# Patient Record
Sex: Female | Born: 1965 | Race: White | Hispanic: No | Smoking: Never smoker
Health system: Southern US, Community
[De-identification: ages and names within clinical notes are randomized; demographics above are authoritative.]

## PROBLEM LIST (undated history)

## (undated) DIAGNOSIS — Z973 Presence of spectacles and contact lenses: Secondary | ICD-10-CM

## (undated) DIAGNOSIS — H8110 Benign paroxysmal vertigo, unspecified ear: Secondary | ICD-10-CM

## (undated) DIAGNOSIS — J45909 Unspecified asthma, uncomplicated: Secondary | ICD-10-CM

## (undated) DIAGNOSIS — K219 Gastro-esophageal reflux disease without esophagitis: Secondary | ICD-10-CM

## (undated) DIAGNOSIS — M5020 Other cervical disc displacement, unspecified cervical region: Secondary | ICD-10-CM

## (undated) HISTORY — PX: UTERINE FIBROID SURGERY: SHX826

## (undated) HISTORY — PX: TONSILLECTOMY: SUR1361

## (undated) HISTORY — PX: COLONOSCOPY: SHX174

---

## 2006-10-01 ENCOUNTER — Ambulatory Visit: Payer: Self-pay | Admitting: Unknown Physician Specialty

## 2008-10-30 DEATH — deceased

## 2009-11-07 DIAGNOSIS — Z719 Counseling, unspecified: Secondary | ICD-10-CM | POA: Insufficient documentation

## 2012-04-30 ENCOUNTER — Inpatient Hospital Stay: Payer: Self-pay | Admitting: Psychiatry

## 2012-04-30 LAB — COMPREHENSIVE METABOLIC PANEL
Alkaline Phosphatase: 65 U/L (ref 50–136)
BUN: 14 mg/dL (ref 7–18)
Bilirubin,Total: 0.6 mg/dL (ref 0.2–1.0)
Calcium, Total: 9.1 mg/dL (ref 8.5–10.1)
Chloride: 107 mmol/L (ref 98–107)
Co2: 23 mmol/L (ref 21–32)
EGFR (Non-African Amer.): >60
Glucose: 133 mg/dL — ABNORMAL HIGH (ref 65–99)
SGOT(AST): 11 U/L — ABNORMAL LOW (ref 15–37)
SGPT (ALT): 15 U/L (ref 12–78)

## 2012-04-30 LAB — CBC
MCH: 33.4 pg (ref 26.0–34.0)
MCHC: 35.1 g/dL (ref 32.0–36.0)
MCV: 95 fL (ref 80–100)
Platelet: 300 10*3/uL (ref 150–440)
RBC: 4.46 10*6/uL (ref 3.80–5.20)
RDW: 12.9 % (ref 11.5–14.5)

## 2012-04-30 LAB — URINALYSIS, COMPLETE
Bacteria: NONE SEEN
Bilirubin,UR: NEGATIVE
Leukocyte Esterase: NEGATIVE
Nitrite: NEGATIVE
Protein: NEGATIVE

## 2012-04-30 LAB — DRUG SCREEN, URINE
Barbiturates, Ur Screen: NEGATIVE (ref ?–200)
Benzodiazepine, Ur Scrn: POSITIVE (ref ?–200)
Cannabinoid 50 Ng, Ur ~~LOC~~: NEGATIVE (ref ?–50)
Cocaine Metabolite,Ur ~~LOC~~: NEGATIVE (ref ?–300)
MDMA (Ecstasy)Ur Screen: NEGATIVE (ref ?–500)
Tricyclic, Ur Screen: NEGATIVE (ref ?–1000)

## 2012-04-30 LAB — ACETAMINOPHEN LEVEL: Acetaminophen: <2 ug/mL

## 2012-04-30 LAB — PREGNANCY, URINE: Pregnancy Test, Urine: NEGATIVE m[IU]/mL

## 2012-04-30 LAB — ETHANOL
Ethanol %: <0.003 % (ref 0.000–0.080)
Ethanol: <3 mg/dL

## 2012-05-31 DIAGNOSIS — R202 Paresthesia of skin: Secondary | ICD-10-CM | POA: Insufficient documentation

## 2014-05-03 ENCOUNTER — Ambulatory Visit (INDEPENDENT_AMBULATORY_CARE_PROVIDER_SITE_OTHER): Payer: BC Managed Care – PPO

## 2014-05-03 ENCOUNTER — Ambulatory Visit (INDEPENDENT_AMBULATORY_CARE_PROVIDER_SITE_OTHER): Payer: BC Managed Care – PPO | Admitting: Podiatry

## 2014-05-03 ENCOUNTER — Encounter: Payer: Self-pay | Admitting: Podiatry

## 2014-05-03 VITALS — BP 110/84 | HR 95 | Resp 16 | Ht 61.0 in | Wt 120.0 lb

## 2014-05-03 DIAGNOSIS — D3610 Benign neoplasm of peripheral nerves and autonomic nervous system, unspecified: Secondary | ICD-10-CM

## 2014-05-03 DIAGNOSIS — M778 Other enthesopathies, not elsewhere classified: Secondary | ICD-10-CM

## 2014-05-03 DIAGNOSIS — M779 Enthesopathy, unspecified: Principal | ICD-10-CM

## 2014-05-03 DIAGNOSIS — D219 Benign neoplasm of connective and other soft tissue, unspecified: Secondary | ICD-10-CM

## 2014-05-03 DIAGNOSIS — M7750 Other enthesopathy of unspecified foot: Secondary | ICD-10-CM

## 2014-05-03 DIAGNOSIS — M79609 Pain in unspecified limb: Secondary | ICD-10-CM

## 2014-05-03 MED ORDER — MELOXICAM 15 MG PO TABS: 15.0000 mg | ORAL_TABLET | Freq: Every day | ORAL | Status: DC

## 2014-05-03 MED ORDER — METHYLPREDNISOLONE (PAK) 4 MG PO TABS: ORAL_TABLET | ORAL | Status: DC

## 2014-05-03 NOTE — Progress Notes (Signed)
   Subjective:    Patient ID: Evelyn Choi, female    DOB: July 06, 1966, 48 y.o.   MRN: [id number]  HPI Comments: Its my left foot under my 2nd and 3rd toes. My right foot hurts under the toes too. Its been going on since April. The pain goes from worse to better. It hurts to walk and it hurts to wear shoes. When i walk fast the pain is worse. i have tried to stay off my foot, elevation, rub peppermint on it.  Foot Pain      Review of Systems  HENT: Positive for sinus pressure.   Allergic/Immunologic: Positive for environmental allergies.  Neurological: Positive for tremors and light-headedness.  Psychiatric/Behavioral: The patient is nervous/anxious.   All other systems reviewed and are negative.      Objective:   Physical Exam: I have reviewed her past mental history medications allergies surgeries social history and review of systems. Pulses are strongly palpable bilateral. Neurologic sensorium is intact per Semmes-Weinstein monofilament. Degenerative flexor intact bilateral muscle strength is 5 over 5 dorsiflexors plantar flexors inverters everters all intrinsic musculature is intact. She has pain on palpation and on end range of motion of the second metatarsophalangeal joint of the left foot. Radiographic evaluation does not demonstrate any type of osseous abnormalities the bilateral foot.        Assessment & Plan:  Assessment: Capsulitis second metatarsophalangeal joint left foot.  Plan: Injected around the joint of the second metatarsophalangeal joint today placed her in a carbon graphite insole left foot and wrote a prescription for Medrol Dosepak to be followed by meloxicam appear

## 2014-06-07 ENCOUNTER — Ambulatory Visit: Payer: BC Managed Care – PPO | Admitting: Podiatry

## 2014-12-06 DIAGNOSIS — N3010 Interstitial cystitis (chronic) without hematuria: Secondary | ICD-10-CM | POA: Insufficient documentation

## 2014-12-06 DIAGNOSIS — N3020 Other chronic cystitis without hematuria: Secondary | ICD-10-CM | POA: Insufficient documentation

## 2014-12-06 DIAGNOSIS — Q600 Renal agenesis, unilateral: Secondary | ICD-10-CM | POA: Insufficient documentation

## 2014-12-06 DIAGNOSIS — R3129 Other microscopic hematuria: Secondary | ICD-10-CM | POA: Insufficient documentation

## 2014-12-06 DIAGNOSIS — R339 Retention of urine, unspecified: Secondary | ICD-10-CM | POA: Insufficient documentation

## 2014-12-19 NOTE — Consult Note (Signed)
Brief Consult Note: Diagnosis: Major depressive disorder, panic disorder with agoraphobia..   Patient was seen by consultant.   Recommend further assessment or treatment.   Orders entered.   Discussed with Attending MD.   Comments: a lifelong h/o depression and anxiety. She did well on Celexa in the past but d/ced medication several months ago. For the past two months she has been increasingly depresed and suicidal. her father committed a suicida at the age of 48 and she is "just like my father". She has intrusive thoughts of suicide. She removed guns from the house afraid thet she would "snap just like my father". She has panic attacks "all the time".  PLAN: will admitt to [facility]..  Electronic Signatures: Orson Slick (MD)  (Signed [phone] 16:21)  Authored: Brief Consult Note   Last Updated: 30-Aug-13 16:21 by Orson Slick (MD)
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
## 2015-04-03 DIAGNOSIS — J45909 Unspecified asthma, uncomplicated: Secondary | ICD-10-CM | POA: Insufficient documentation

## 2016-02-21 DIAGNOSIS — G250 Essential tremor: Secondary | ICD-10-CM | POA: Insufficient documentation

## 2016-06-10 ENCOUNTER — Telehealth: Payer: Self-pay | Admitting: Gastroenterology

## 2016-06-10 NOTE — Telephone Encounter (Signed)
colonoscopy

## 2016-06-25 ENCOUNTER — Other Ambulatory Visit: Payer: Self-pay

## 2016-06-25 NOTE — Telephone Encounter (Signed)
Gastroenterology Pre-Procedure Review  Request Date: 07/09/2016 Requesting Physician: Dr. Vicente Males  PATIENT REVIEW QUESTIONS: The patient responded to the following health history questions as indicated:    1. Are you having any GI issues? no 2. Do you have a personal history of Polyps? no 3. Do you have a family history of Colon Cancer or Polyps? yes (sister, polyps) 4. Diabetes Mellitus? no 5. Joint replacements in the past 12 months?no 6. Major health problems in the past 3 months?no 7. Any artificial heart valves, MVP, or defibrillator?no    MEDICATIONS & ALLERGIES:    Patient reports the following regarding taking any anticoagulation/antiplatelet therapy:   Plavix, Coumadin, Eliquis, Xarelto, Lovenox, Pradaxa, Brilinta, or Effient? no Aspirin? no  Patient confirms/reports the following medications:  Current Outpatient Prescriptions  Medication Sig Dispense Refill  . citalopram (CELEXA) 10 MG tablet Take by mouth.    . meloxicam (MOBIC) 15 MG tablet Take 1 tablet (15 mg total) by mouth daily. 30 tablet 3  . methylPREDNIsolone (MEDROL DOSPACK) 4 MG tablet follow package directions 21 tablet 0  . propranolol (INDERAL) 20 MG tablet Take 20 mg by mouth 3 (three) times daily.     No current facility-administered medications for this visit.     Patient confirms/reports the following allergies:  Allergies  Allergen Reactions  . Macrobid [Nitrofurantoin] Shortness Of Breath and Rash    Chest Pains  . Lexapro [Escitalopram Oxalate] Anxiety    No orders of the defined types were placed in this encounter.   AUTHORIZATION INFORMATION Primary Insurance: 1D#: Group #:  Secondary Insurance: 1D#: Group #:  SCHEDULE INFORMATION: Date:  Time: Location:

## 2016-06-25 NOTE — Telephone Encounter (Signed)
Screening Colonoscopy Z12.11 [facility] [id number] BCBS Pre cert is not required

## 2016-07-02 ENCOUNTER — Encounter: Payer: Self-pay | Admitting: *Deleted

## 2016-07-09 ENCOUNTER — Ambulatory Visit
Admission: RE | Admit: 2016-07-09 | Discharge: 2016-07-09 | Disposition: A | Discharge status: Home / Self Care | Payer: BLUE CROSS/BLUE SHIELD | Source: Ambulatory Visit | Attending: Gastroenterology | Admitting: Gastroenterology

## 2016-07-09 ENCOUNTER — Ambulatory Visit: Payer: BLUE CROSS/BLUE SHIELD | Admitting: Anesthesiology

## 2016-07-09 ENCOUNTER — Encounter
Admission: RE | Disposition: A | Discharge status: Home / Self Care | Payer: Self-pay | Source: Ambulatory Visit | Attending: Gastroenterology

## 2016-07-09 DIAGNOSIS — Z79899 Other long term (current) drug therapy: Secondary | ICD-10-CM | POA: Diagnosis not present

## 2016-07-09 DIAGNOSIS — J45909 Unspecified asthma, uncomplicated: Secondary | ICD-10-CM | POA: Diagnosis not present

## 2016-07-09 DIAGNOSIS — H8110 Benign paroxysmal vertigo, unspecified ear: Secondary | ICD-10-CM | POA: Diagnosis not present

## 2016-07-09 DIAGNOSIS — K219 Gastro-esophageal reflux disease without esophagitis: Secondary | ICD-10-CM | POA: Diagnosis not present

## 2016-07-09 DIAGNOSIS — Z8371 Family history of colonic polyps: Secondary | ICD-10-CM | POA: Diagnosis not present

## 2016-07-09 DIAGNOSIS — Q438 Other specified congenital malformations of intestine: Secondary | ICD-10-CM | POA: Diagnosis not present

## 2016-07-09 DIAGNOSIS — Z1211 Encounter for screening for malignant neoplasm of colon: Secondary | ICD-10-CM | POA: Insufficient documentation

## 2016-07-09 HISTORY — DX: Benign paroxysmal vertigo, unspecified ear: H81.10

## 2016-07-09 HISTORY — PX: COLONOSCOPY WITH PROPOFOL: SHX5780

## 2016-07-09 HISTORY — DX: Presence of spectacles and contact lenses: Z97.3

## 2016-07-09 HISTORY — DX: Other cervical disc displacement, unspecified cervical region: M50.20

## 2016-07-09 HISTORY — DX: Gastro-esophageal reflux disease without esophagitis: K21.9

## 2016-07-09 HISTORY — DX: Unspecified asthma, uncomplicated: J45.909

## 2016-07-09 SURGERY — COLONOSCOPY WITH PROPOFOL
Anesthesia: Monitor Anesthesia Care | Site: Rectum | Wound class: Contaminated

## 2016-07-09 MED ORDER — PROPOFOL 10 MG/ML IV BOLUS
INTRAVENOUS | Status: DC | PRN
  Administered 2016-07-09 (×4): 20 mg via INTRAVENOUS
  Administered 2016-07-09: 70 mg via INTRAVENOUS
  Administered 2016-07-09 (×2): 20 mg via INTRAVENOUS
  Administered 2016-07-09: 30 mg via INTRAVENOUS
  Administered 2016-07-09 (×7): 20 mg via INTRAVENOUS

## 2016-07-09 MED ORDER — OXYCODONE HCL 5 MG PO TABS: 5.0000 mg | ORAL_TABLET | Freq: Once | ORAL | Status: DC | PRN

## 2016-07-09 MED ORDER — LACTATED RINGERS IV SOLN
INTRAVENOUS | Status: DC
  Administered 2016-07-09: 08:00:00 via INTRAVENOUS

## 2016-07-09 MED ORDER — STERILE WATER FOR IRRIGATION IR SOLN
Status: DC | PRN
  Administered 2016-07-09: 08:00:00

## 2016-07-09 MED ORDER — LIDOCAINE HCL (CARDIAC) 20 MG/ML IV SOLN
INTRAVENOUS | Status: DC | PRN
  Administered 2016-07-09: 40 mg via INTRAVENOUS

## 2016-07-09 MED ORDER — OXYCODONE HCL 5 MG/5ML PO SOLN: 5.0000 mg | Freq: Once | ORAL | Status: DC | PRN

## 2016-07-09 SURGICAL SUPPLY — 23 items

## 2016-07-09 NOTE — Anesthesia Preprocedure Evaluation (Signed)
Anesthesia Evaluation   Patient awake    Reviewed: Allergy & Precautions, NPO status   History of Anesthesia Complications Negative for: history of anesthetic complications  Airway Mallampati: I  TM Distance: >3 FB Neck ROM: full    Dental no notable dental hx.    Pulmonary asthma ,    Pulmonary exam normal        Cardiovascular negative cardio ROS Normal cardiovascular exam     Neuro/Psych negative neurological ROS     GI/Hepatic Neg liver ROS, GERD  ,  Endo/Other  negative endocrine ROS  Renal/GU negative Renal ROS  negative genitourinary   Musculoskeletal   Abdominal   Peds negative pediatric ROS (+)  Hematology negative hematology ROS (+)   Anesthesia Other Findings   Reproductive/Obstetrics                             Anesthesia Physical Anesthesia Plan  ASA: II  Anesthesia Plan: MAC   Post-op Pain Management:    Induction:   Airway Management Planned:   Additional Equipment:   Intra-op Plan:   Post-operative Plan:   Informed Consent:   Plan Discussed with:   Anesthesia Plan Comments:         Anesthesia Quick Evaluation

## 2016-07-09 NOTE — Anesthesia Postprocedure Evaluation (Addendum)
Anesthesia Post Note  Patient: Evelyn Choi  Procedure(s) Performed: Procedure(s) (LRB): COLONOSCOPY WITH PROPOFOL (N/A)  Patient location during evaluation: PACU Anesthesia Type: MAC Level of consciousness: awake and alert Pain management: pain level controlled Vital Signs Assessment: post-procedure vital signs reviewed and stable Respiratory status: spontaneous breathing Cardiovascular status: blood pressure returned to baseline Postop Assessment: no headache Anesthetic complications: no    Jaci Standard, III,  Jeriah Corkum D

## 2016-07-09 NOTE — Anesthesia Procedure Notes (Signed)
Procedure Name: MAC Performed by: Matti Killingsworth Pre-anesthesia Checklist: Patient identified, Emergency Drugs available, Suction available, Timeout performed and Patient being monitored Patient Re-evaluated:Patient Re-evaluated prior to inductionOxygen Delivery Method: Nasal cannula Placement Confirmation: positive ETCO2       

## 2016-07-09 NOTE — Op Note (Signed)
[facility] Gastroenterology Patient Name: Evelyn Choi Procedure Date: 07/09/2016 7:22 AM MRN: [id number] Account #: [id number] Date of Birth: 1965/12/08 Admit Type: Ambulatory Age: 50 Room: [facility] OR ROOM 01 Gender: Female Note Status: Finalized Procedure:            Colonoscopy Indications:          Colon cancer screening in patient at increased risk:                        Family history of 1st-degree relative with colon polyps                        before age 49 years Providers:            Jonathon Bellows MD, MD, Carmelina Dane. Sheppard Coil MD, MD Referring MD:         Gayland Curry MD, MD (Referring MD) Medicines:            Monitored Anesthesia Care Complications:        No immediate complications. Procedure:            Pre-Anesthesia Assessment:                       - ASA Grade Assessment: II - A patient with mild                        systemic disease.                       - Prior to the procedure, a History and Physical was                        performed, and patient medications, allergies and                        sensitivities were reviewed. The patient's tolerance of                        previous anesthesia was reviewed.                       After obtaining informed consent, the colonoscope was                        passed under direct vision. Throughout the procedure,                        the patient's blood pressure, pulse, and oxygen                        saturations were monitored continuously. The was                        introduced through the anus and advanced to the the                        cecum, identified by the appendiceal orifice, IC valve                        and transillumination. The colonoscopy was somewhat  difficult due to a tortuous colon. The patient                        tolerated the procedure well. The quality of the bowel                        preparation was excellent. Findings:      The  entire examined colon appeared normal on direct and retroflexion       views. Impression:           - The entire examined colon is normal on direct and                        retroflexion views.                       - No specimens collected. Recommendation:       - Patient has a contact number available for                        emergencies. The signs and symptoms of potential                        delayed complications were discussed with the patient.                        Return to normal activities tomorrow. Written discharge                        instructions were provided to the patient.                       - Resume previous diet.                       - Continue present medications. Procedure Code(s):    --- Professional ---                       [id number], Colorectal cancer screening; colonoscopy on                        individual at high risk Diagnosis Code(s):    --- Professional ---                       Z83.71, Family history of colonic polyps CPT copyright 2016 American Medical Association. All rights reserved. The codes documented in this report are preliminary and upon coder review may  be revised to meet current compliance requirements. Attending Participation:      I personally performed the entire procedure. Jonathon Bellows, MD Jonathon Bellows MD, MD 07/09/2016 8:01:20 AM This report has been signed electronically. Gayland Curry MD, MD Number of Addenda: 0 Note Initiated On: 07/09/2016 7:22 AM Scope Withdrawal Time: 0 hours 6 minutes 50 seconds  Total Procedure Duration: 0 hours 21 minutes 20 seconds       [facility]

## 2016-07-09 NOTE — Transfer of Care (Signed)
Immediate Anesthesia Transfer of Care Note  Patient: Evelyn Choi  Procedure(s) Performed: Procedure(s): COLONOSCOPY WITH PROPOFOL (N/A)  Patient Location: PACU  Anesthesia Type: MAC  Level of Consciousness: awake, alert  and patient cooperative  Airway and Oxygen Therapy: Patient Spontanous Breathing and Patient connected to supplemental oxygen  Post-op Assessment: Post-op Vital signs reviewed, Patient's Cardiovascular Status Stable, Respiratory Function Stable, Patent Airway and No signs of Nausea or vomiting  Post-op Vital Signs: Reviewed and stable  Complications: No apparent anesthesia complications

## 2016-07-09 NOTE — H&P (Signed)
  Jonathon Bellows MD [address], [place], [place] [address] Phone: [phone] Fax : [phone]  Primary Care Physician:  Gayland Curry, MD Primary Gastroenterologist:  Dr. Jonathon Bellows   Pre-Procedure History & Physical: HPI:  Evelyn Choi is a 50 y.o. female is here for an colonoscopy.   Past Medical History:  Diagnosis Date  . Asthma   . BPPV (benign paroxysmal positional vertigo), unspecified laterality    2-3 episodes/yr  . GERD (gastroesophageal reflux disease)    no current issues  . Herniated disc, cervical    no limitations  . Wears contact lenses     Past Surgical History:  Procedure Laterality Date  . COLONOSCOPY    . TONSILLECTOMY    . UTERINE FIBROID SURGERY      Prior to Admission medications   Medication Sig Start Date End Date Taking? Authorizing Provider  albuterol (PROVENTIL HFA;VENTOLIN HFA) 108 (90 Base) MCG/ACT inhaler Inhale into the lungs every 6 (six) hours as needed for wheezing or shortness of breath.   Yes Historical Provider, MD  citalopram (CELEXA) 10 MG tablet Take by mouth.   Yes Historical Provider, MD  propranolol (INDERAL) 20 MG tablet Take 20 mg by mouth 3 (three) times daily.   Yes Historical Provider, MD  meloxicam (MOBIC) 15 MG tablet Take 1 tablet (15 mg total) by mouth daily. Patient not taking: Reported on 07/09/2016 05/03/14   Max T Hyatt, DPM    Allergies as of 06/25/2016 - Review Complete 06/25/2016  Allergen Reaction Noted  . Macrobid [nitrofurantoin] Shortness Of Breath and Rash 06/25/2016  . Lexapro [escitalopram oxalate] Anxiety 06/23/2012    History reviewed. No pertinent family history.  Social History   Social History  . Marital status: Single    Spouse name: N/A  . Number of children: N/A  . Years of education: N/A   Occupational History  . Not on file.   Social History Main Topics  . Smoking status: Never Smoker  . Smokeless tobacco: Never Used  . Alcohol use 0.6 oz/week    1 Cans of beer per  week  . Drug use: Unknown  . Sexual activity: Not on file   Other Topics Concern  . Not on file   Social History Narrative  . No narrative on file    Review of Systems: See HPI, otherwise negative ROS  Physical Exam: BP 99/67   Pulse 83   Temp 97.1 F (36.2 C) (Temporal)   Ht 5\' 1"  (1.549 m)   Wt 124 lb (56.2 kg)   SpO2 99%   BMI 23.43 kg/m  General:   Alert,  pleasant and cooperative in NAD Head:  Normocephalic and atraumatic. Neck:  Supple; no masses or thyromegaly. Lungs:  Clear throughout to auscultation.    Heart:  Regular rate and rhythm. Abdomen:  Soft, nontender and nondistended. Normal bowel sounds, without guarding, and without rebound.   Neurologic:  Alert and  oriented x4;  grossly normal neurologically.  Impression/Plan: Evelyn Choi is here for an colonoscopy to be performed for colorectal cancer screening , high risk due to sister having colon polyps   Risks, benefits, limitations, and alternatives regarding  colonoscopy have been reviewed with the patient.  Questions have been answered.  All parties agreeable.   Jonathon Bellows, MD  07/09/2016, 7:14 AM

## 2018-02-25 DIAGNOSIS — E7800 Pure hypercholesterolemia, unspecified: Secondary | ICD-10-CM | POA: Insufficient documentation

## 2018-09-01 HISTORY — PX: LASIK: SHX215

## 2018-09-06 ENCOUNTER — Other Ambulatory Visit (HOSPITAL_COMMUNITY)
Admission: RE | Admit: 2018-09-06 | Discharge: 2018-09-06 | Disposition: A | Discharge status: Home / Self Care | Payer: BLUE CROSS/BLUE SHIELD | Source: Ambulatory Visit | Attending: Obstetrics and Gynecology | Admitting: Obstetrics and Gynecology

## 2018-09-06 ENCOUNTER — Ambulatory Visit (INDEPENDENT_AMBULATORY_CARE_PROVIDER_SITE_OTHER): Payer: BLUE CROSS/BLUE SHIELD | Admitting: Obstetrics and Gynecology

## 2018-09-06 ENCOUNTER — Encounter: Payer: Self-pay | Admitting: Obstetrics and Gynecology

## 2018-09-06 VITALS — BP 120/70 | HR 85 | Ht 61.0 in | Wt 133.0 lb

## 2018-09-06 DIAGNOSIS — Z01419 Encounter for gynecological examination (general) (routine) without abnormal findings: Secondary | ICD-10-CM | POA: Diagnosis not present

## 2018-09-06 DIAGNOSIS — Z124 Encounter for screening for malignant neoplasm of cervix: Secondary | ICD-10-CM | POA: Insufficient documentation

## 2018-09-06 DIAGNOSIS — Z1239 Encounter for other screening for malignant neoplasm of breast: Secondary | ICD-10-CM

## 2018-09-06 MED ORDER — VALACYCLOVIR HCL 500 MG PO TABS: 500.0000 mg | ORAL_TABLET | Freq: Two times a day (BID) | ORAL | 6 refills | Status: AC

## 2018-09-06 NOTE — Progress Notes (Signed)
Gynecology Annual Exam  PCP: Gayland Curry, MD  Chief Complaint:  Chief Complaint  Patient presents with  . Gynecologic Exam    History of Present Illness:Patient is a 53 y.o. G2P0010 presents for annual exam. The patient has no complaints today.   LMP: No LMP recorded. Patient is postmenopausal. No postmenopausal bleeding  The patient is sexually active. She denies dyspareunia.  The patient does not perform self breast exams.  There is no notable family history of breast or ovarian cancer in her family.  The patient wears seatbelts: yes.   The patient has regular exercise: no.    The patient denies current symptoms of depression.     Review of Systems: Review of Systems  Constitutional: Negative for chills and fever.  HENT: Negative for congestion.   Respiratory: Negative for cough and shortness of breath.   Cardiovascular: Negative for chest pain and palpitations.  Gastrointestinal: Negative for abdominal pain, constipation, diarrhea, heartburn, nausea and vomiting.  Genitourinary: Negative for dysuria, frequency and urgency.  Skin: Negative for itching and rash.  Neurological: Negative for dizziness and headaches.  Endo/Heme/Allergies: Negative for polydipsia.  Psychiatric/Behavioral: Negative for depression.    Past Medical History:  Past Medical History:  Diagnosis Date  . Asthma   . BPPV (benign paroxysmal positional vertigo), unspecified laterality    2-3 episodes/yr  . GERD (gastroesophageal reflux disease)    no current issues  . Herniated disc, cervical    no limitations  . Wears contact lenses     Past Surgical History:  Past Surgical History:  Procedure Laterality Date  . COLONOSCOPY    . COLONOSCOPY WITH PROPOFOL N/A 07/09/2016   Procedure: COLONOSCOPY WITH PROPOFOL;  Surgeon: Jonathon Bellows, MD;  Location: [facility];  Service: Endoscopy;  Laterality: N/A;  . TONSILLECTOMY    . UTERINE FIBROID SURGERY      Gynecologic History:  No  LMP recorded. Patient is postmenopausal. Last Pap: Results were: 05/15/2015 no abnormalities   Obstetric History: G2P0010  Family History:  History reviewed. No pertinent family history.  Social History:  Social History   Socioeconomic History  . Marital status: Single    Spouse name: Not on file  . Number of children: Not on file  . Years of education: Not on file  . Highest education level: Not on file  Occupational History  . Not on file  Social Needs  . Financial resource strain: Not on file  . Food insecurity:    Worry: Not on file    Inability: Not on file  . Transportation needs:    Medical: Not on file    Non-medical: Not on file  Tobacco Use  . Smoking status: Never Smoker  . Smokeless tobacco: Never Used  Substance and Sexual Activity  . Alcohol use: Yes    Alcohol/week: 1.0 standard drinks    Types: 1 Cans of beer per week  . Drug use: Never  . Sexual activity: Yes    Birth control/protection: Post-menopausal  Lifestyle  . Physical activity:    Days per week: Not on file    Minutes per session: Not on file  . Stress: Not on file  Relationships  . Social connections:    Talks on phone: Not on file    Gets together: Not on file    Attends religious service: Not on file    Active member of club or organization: Not on file    Attends meetings of clubs or organizations: Not on  file    Relationship status: Not on file  . Intimate partner violence:    Fear of current or ex partner: Not on file    Emotionally abused: Not on file    Physically abused: Not on file    Forced sexual activity: Not on file  Other Topics Concern  . Not on file  Social History Narrative  . Not on file    Allergies:  Allergies  Allergen Reactions  . Macrobid [Nitrofurantoin] Shortness Of Breath and Rash    Chest Pains  . Lexapro [Escitalopram Oxalate] Anxiety    Medications: Prior to Admission medications   Medication Sig Start Date End Date Taking? Authorizing  Provider  meloxicam (MOBIC) 15 MG tablet Take 1 tablet (15 mg total) by mouth daily. 05/03/14  Yes Hyatt, Max T, DPM  propranolol (INDERAL) 20 MG tablet Take 20 mg by mouth 3 (three) times daily.   Yes [provider]  albuterol (PROVENTIL HFA;VENTOLIN HFA) 108 (90 Base) MCG/ACT inhaler Inhale into the lungs every 6 (six) hours as needed for wheezing or shortness of breath.    [provider]  citalopram (CELEXA) 10 MG tablet Take by mouth.    [provider]    Physical Exam Vitals: Blood pressure 120/70, pulse 85, height 5\' 1"  (1.549 m), weight 133 lb (60.3 kg).  General: NAD HEENT: normocephalic, anicteric Thyroid: no enlargement, no palpable nodules Pulmonary: No increased work of breathing, CTAB Cardiovascular: RRR, distal pulses 2+ Breast: Breast symmetrical, no tenderness, no palpable nodules or masses, no skin or nipple retraction present, no nipple discharge.  No axillary or supraclavicular lymphadenopathy. Abdomen: NABS, soft, non-tender, non-distended.  Umbilicus without lesions.  No hepatomegaly, splenomegaly or masses palpable. No evidence of hernia  Genitourinary:  External: Normal external female genitalia.  Normal urethral meatus, normal Bartholin's and Skene's glands.    Vagina: Normal vaginal mucosa, no evidence of prolapse.    Cervix: Grossly normal in appearance, no bleeding  Uterus: Non-enlarged, mobile, normal contour.  No CMT  Adnexa: ovaries non-enlarged, no adnexal masses  Rectal: deferred  Lymphatic: no evidence of inguinal lymphadenopathy Extremities: no edema, erythema, or tenderness Neurologic: Grossly intact Psychiatric: mood appropriate, affect full  Female chaperone present for pelvic and breast  portions of the physical exam     Assessment: 53 y.o. G2P0010 routine annual exam  Plan: Problem List Items Addressed This Visit    None    Visit Diagnoses    Breast screening    -  Primary   Relevant Orders   MM 3D SCREEN  BREAST BILATERAL   Screening for malignant neoplasm of cervix       Relevant Orders   Cytology - PAP   Encounter for gynecological examination without abnormal finding          1) Mammogram - recommend yearly screening mammogram.  Mammogram Was ordered today  2) STI screening  was notoffered and therefore not obtained  3) ASCCP guidelines and rational discussed.  Patient opts for every 3 years screening interval  4) Osteoporosis  - per USPTF routine screening DEXA at age 57  5) Routine healthcare maintenance including cholesterol, diabetes screening discussed managed by PCP  6) Colonoscopy - UTD 2 years ago  7) Rx - valtrex HSV  8) Return in about 1 year (around 09/07/2019) for annual.    Malachy Mood, MD Mosetta Pigeon, [facility] 09/06/2018, 9:19 AM

## 2018-09-06 NOTE — Patient Instructions (Signed)
[facility] [address] [place] [address]  [facility]  [address] [place] [address]  Phone: [phone]  

## 2018-09-07 LAB — CYTOLOGY - PAP
Diagnosis: NEGATIVE
HPV (WINDOPATH): NOT DETECTED

## 2018-10-28 DIAGNOSIS — R251 Tremor, unspecified: Secondary | ICD-10-CM | POA: Insufficient documentation

## 2018-10-28 DIAGNOSIS — R413 Other amnesia: Secondary | ICD-10-CM | POA: Insufficient documentation

## 2018-11-01 ENCOUNTER — Other Ambulatory Visit: Payer: Self-pay | Admitting: Neurology

## 2018-11-01 DIAGNOSIS — R413 Other amnesia: Secondary | ICD-10-CM

## 2018-11-01 DIAGNOSIS — R251 Tremor, unspecified: Secondary | ICD-10-CM

## 2018-11-09 ENCOUNTER — Ambulatory Visit
Admission: RE | Admit: 2018-11-09 | Discharge: 2018-11-09 | Disposition: A | Discharge status: Home / Self Care | Payer: BLUE CROSS/BLUE SHIELD | Source: Ambulatory Visit | Attending: Neurology | Admitting: Neurology

## 2018-11-09 ENCOUNTER — Other Ambulatory Visit: Payer: Self-pay

## 2018-11-09 DIAGNOSIS — R413 Other amnesia: Secondary | ICD-10-CM | POA: Insufficient documentation

## 2018-11-09 DIAGNOSIS — R251 Tremor, unspecified: Secondary | ICD-10-CM | POA: Insufficient documentation

## 2018-12-07 ENCOUNTER — Encounter: Payer: BLUE CROSS/BLUE SHIELD | Admitting: Speech Pathology

## 2018-12-10 ENCOUNTER — Encounter: Payer: BLUE CROSS/BLUE SHIELD | Admitting: Speech Pathology

## 2018-12-14 ENCOUNTER — Encounter: Payer: BLUE CROSS/BLUE SHIELD | Admitting: Speech Pathology

## 2018-12-17 ENCOUNTER — Encounter: Payer: BLUE CROSS/BLUE SHIELD | Admitting: Speech Pathology

## 2018-12-21 ENCOUNTER — Encounter: Payer: BLUE CROSS/BLUE SHIELD | Admitting: Speech Pathology

## 2018-12-23 ENCOUNTER — Encounter: Payer: BLUE CROSS/BLUE SHIELD | Admitting: Speech Pathology

## 2019-01-04 ENCOUNTER — Encounter: Payer: BLUE CROSS/BLUE SHIELD | Admitting: Speech Pathology

## 2019-01-06 ENCOUNTER — Encounter: Payer: BLUE CROSS/BLUE SHIELD | Admitting: Speech Pathology

## 2019-01-11 ENCOUNTER — Encounter: Payer: BLUE CROSS/BLUE SHIELD | Admitting: Speech Pathology

## 2019-01-13 ENCOUNTER — Encounter: Payer: BLUE CROSS/BLUE SHIELD | Admitting: Speech Pathology

## 2019-01-18 ENCOUNTER — Encounter: Payer: BLUE CROSS/BLUE SHIELD | Admitting: Speech Pathology

## 2019-01-20 ENCOUNTER — Encounter: Payer: BLUE CROSS/BLUE SHIELD | Admitting: Speech Pathology

## 2019-01-25 ENCOUNTER — Encounter: Payer: BLUE CROSS/BLUE SHIELD | Admitting: Speech Pathology

## 2019-01-28 ENCOUNTER — Encounter: Payer: BLUE CROSS/BLUE SHIELD | Admitting: Speech Pathology

## 2019-02-01 ENCOUNTER — Encounter: Payer: BLUE CROSS/BLUE SHIELD | Admitting: Speech Pathology

## 2019-02-03 ENCOUNTER — Encounter: Payer: BLUE CROSS/BLUE SHIELD | Admitting: Speech Pathology

## 2019-02-08 ENCOUNTER — Encounter: Payer: BLUE CROSS/BLUE SHIELD | Admitting: Speech Pathology

## 2019-02-10 ENCOUNTER — Encounter: Payer: BLUE CROSS/BLUE SHIELD | Admitting: Speech Pathology

## 2019-05-16 DIAGNOSIS — G5620 Lesion of ulnar nerve, unspecified upper limb: Secondary | ICD-10-CM | POA: Insufficient documentation

## 2019-05-16 DIAGNOSIS — G5600 Carpal tunnel syndrome, unspecified upper limb: Secondary | ICD-10-CM | POA: Insufficient documentation

## 2019-05-16 DIAGNOSIS — M25519 Pain in unspecified shoulder: Secondary | ICD-10-CM | POA: Insufficient documentation

## 2019-05-16 DIAGNOSIS — M4802 Spinal stenosis, cervical region: Secondary | ICD-10-CM | POA: Insufficient documentation

## 2019-05-16 DIAGNOSIS — M5030 Other cervical disc degeneration, unspecified cervical region: Secondary | ICD-10-CM | POA: Insufficient documentation

## 2019-05-16 DIAGNOSIS — M542 Cervicalgia: Secondary | ICD-10-CM | POA: Insufficient documentation

## 2019-05-16 DIAGNOSIS — M5412 Radiculopathy, cervical region: Secondary | ICD-10-CM | POA: Insufficient documentation

## 2019-05-17 DIAGNOSIS — R079 Chest pain, unspecified: Secondary | ICD-10-CM | POA: Insufficient documentation

## 2019-05-17 DIAGNOSIS — I447 Left bundle-branch block, unspecified: Secondary | ICD-10-CM | POA: Insufficient documentation

## 2019-05-17 DIAGNOSIS — R002 Palpitations: Secondary | ICD-10-CM | POA: Insufficient documentation

## 2019-10-11 ENCOUNTER — Other Ambulatory Visit: Payer: Self-pay | Admitting: Obstetrics and Gynecology

## 2019-11-24 ENCOUNTER — Ambulatory Visit: Payer: BC Managed Care – PPO | Attending: Internal Medicine

## 2019-11-24 DIAGNOSIS — Z23 Encounter for immunization: Secondary | ICD-10-CM

## 2019-11-24 NOTE — Progress Notes (Signed)
   Covid-19 Vaccination Clinic  Name:  Evelyn Choi    MRN: [id number] DOB: [birth year]  11/24/2019  [name] was observed post Covid-19 immunization for 15 minutes without incident. She was provided with Vaccine Information Sheet and instruction to access the V-Safe system.   [name] was instructed to call 911 with any severe reactions post vaccine: Marland Kitchen Difficulty breathing  . Swelling of face and throat  . A fast heartbeat  . A bad rash all over body  . Dizziness and weakness   Immunizations Administered    Name Date Dose VIS Date Route   Pfizer COVID-19 Vaccine 11/24/2019  2:17 PM 0.3 mL 08/12/2019 Intramuscular   Manufacturer: [place]   Lot: [id number]   [place]: [id number]

## 2019-12-19 ENCOUNTER — Ambulatory Visit: Payer: BC Managed Care – PPO | Attending: Internal Medicine

## 2019-12-19 DIAGNOSIS — Z23 Encounter for immunization: Secondary | ICD-10-CM

## 2019-12-19 NOTE — Progress Notes (Signed)
   Covid-19 Vaccination Clinic  Name:  Evelyn Choi    MRN: [id number] DOB: July 16, 1966  12/19/2019  Evelyn Choi was observed post Covid-19 immunization for 15 minutes without incident. She was provided with Vaccine Information Sheet and instruction to access the V-Safe system.   Evelyn Choi was instructed to call 911 with any severe reactions post vaccine: Marland Kitchen Difficulty breathing  . Swelling of face and throat  . A fast heartbeat  . A bad rash all over body  . Dizziness and weakness   Immunizations Administered    Name Date Dose VIS Date Route   Pfizer COVID-19 Vaccine 12/19/2019  1:20 PM 0.3 mL 10/26/2018 Intramuscular   Manufacturer: [place]   Lot: [id number]   [place]: [id number]

## 2020-03-21 DIAGNOSIS — R42 Dizziness and giddiness: Secondary | ICD-10-CM | POA: Insufficient documentation

## 2020-03-27 ENCOUNTER — Other Ambulatory Visit: Payer: Self-pay | Admitting: Family Medicine

## 2020-03-27 ENCOUNTER — Other Ambulatory Visit: Payer: Self-pay

## 2020-03-27 DIAGNOSIS — Z1231 Encounter for screening mammogram for malignant neoplasm of breast: Secondary | ICD-10-CM

## 2020-04-03 ENCOUNTER — Ambulatory Visit (INDEPENDENT_AMBULATORY_CARE_PROVIDER_SITE_OTHER): Payer: BC Managed Care – PPO | Admitting: Psychology

## 2020-04-03 DIAGNOSIS — F4310 Post-traumatic stress disorder, unspecified: Secondary | ICD-10-CM | POA: Diagnosis not present

## 2020-04-05 ENCOUNTER — Ambulatory Visit
Admission: RE | Admit: 2020-04-05 | Discharge: 2020-04-05 | Disposition: A | Discharge status: Home / Self Care | Payer: BC Managed Care – PPO | Source: Ambulatory Visit

## 2020-04-05 ENCOUNTER — Other Ambulatory Visit: Payer: Self-pay

## 2020-04-05 DIAGNOSIS — Z1231 Encounter for screening mammogram for malignant neoplasm of breast: Secondary | ICD-10-CM

## 2020-04-09 ENCOUNTER — Ambulatory Visit (INDEPENDENT_AMBULATORY_CARE_PROVIDER_SITE_OTHER): Payer: BC Managed Care – PPO | Admitting: Psychology

## 2020-04-09 DIAGNOSIS — F4310 Post-traumatic stress disorder, unspecified: Secondary | ICD-10-CM

## 2020-04-13 ENCOUNTER — Other Ambulatory Visit: Payer: Self-pay | Admitting: Family Medicine

## 2020-04-13 ENCOUNTER — Other Ambulatory Visit: Payer: Self-pay

## 2020-04-17 ENCOUNTER — Other Ambulatory Visit: Payer: Self-pay

## 2020-04-17 DIAGNOSIS — R928 Other abnormal and inconclusive findings on diagnostic imaging of breast: Secondary | ICD-10-CM

## 2020-04-17 DIAGNOSIS — R921 Mammographic calcification found on diagnostic imaging of breast: Secondary | ICD-10-CM

## 2020-04-18 ENCOUNTER — Ambulatory Visit (INDEPENDENT_AMBULATORY_CARE_PROVIDER_SITE_OTHER): Payer: BC Managed Care – PPO | Admitting: Psychology

## 2020-04-18 DIAGNOSIS — F4310 Post-traumatic stress disorder, unspecified: Secondary | ICD-10-CM

## 2020-04-25 ENCOUNTER — Other Ambulatory Visit: Payer: Self-pay

## 2020-04-25 ENCOUNTER — Ambulatory Visit
Admission: RE | Admit: 2020-04-25 | Discharge: 2020-04-25 | Disposition: A | Discharge status: Home / Self Care | Payer: BC Managed Care – PPO | Source: Ambulatory Visit

## 2020-04-25 DIAGNOSIS — R928 Other abnormal and inconclusive findings on diagnostic imaging of breast: Secondary | ICD-10-CM | POA: Insufficient documentation

## 2020-04-25 DIAGNOSIS — R921 Mammographic calcification found on diagnostic imaging of breast: Secondary | ICD-10-CM | POA: Diagnosis present

## 2020-04-26 ENCOUNTER — Other Ambulatory Visit: Payer: Self-pay | Admitting: Family Medicine

## 2020-04-26 DIAGNOSIS — R921 Mammographic calcification found on diagnostic imaging of breast: Secondary | ICD-10-CM

## 2020-04-26 DIAGNOSIS — R928 Other abnormal and inconclusive findings on diagnostic imaging of breast: Secondary | ICD-10-CM

## 2020-05-01 ENCOUNTER — Other Ambulatory Visit: Payer: Self-pay | Admitting: Family Medicine

## 2020-05-01 DIAGNOSIS — R921 Mammographic calcification found on diagnostic imaging of breast: Secondary | ICD-10-CM

## 2020-05-01 DIAGNOSIS — R928 Other abnormal and inconclusive findings on diagnostic imaging of breast: Secondary | ICD-10-CM

## 2020-05-02 ENCOUNTER — Ambulatory Visit: Payer: BC Managed Care – PPO | Admitting: Psychology

## 2020-05-09 ENCOUNTER — Ambulatory Visit
Admission: RE | Admit: 2020-05-09 | Discharge: 2020-05-09 | Disposition: A | Discharge status: Home / Self Care | Payer: BC Managed Care – PPO | Source: Ambulatory Visit | Attending: Family Medicine | Admitting: Family Medicine

## 2020-05-09 DIAGNOSIS — R921 Mammographic calcification found on diagnostic imaging of breast: Secondary | ICD-10-CM | POA: Insufficient documentation

## 2020-05-09 DIAGNOSIS — R928 Other abnormal and inconclusive findings on diagnostic imaging of breast: Secondary | ICD-10-CM | POA: Insufficient documentation

## 2020-05-09 HISTORY — PX: BREAST BIOPSY: SHX20

## 2020-05-10 LAB — SURGICAL PATHOLOGY

## 2020-05-16 ENCOUNTER — Ambulatory Visit (INDEPENDENT_AMBULATORY_CARE_PROVIDER_SITE_OTHER): Payer: BC Managed Care – PPO | Admitting: Podiatry

## 2020-05-16 ENCOUNTER — Other Ambulatory Visit: Payer: Self-pay

## 2020-05-16 ENCOUNTER — Encounter: Payer: Self-pay | Admitting: Podiatry

## 2020-05-16 DIAGNOSIS — L600 Ingrowing nail: Secondary | ICD-10-CM

## 2020-05-16 NOTE — Progress Notes (Signed)
Subjective:  Patient ID: Evelyn Choi, female    DOB: [birth year],  MRN: [id number] HPI Chief Complaint  Patient presents with  . Nail Problem    Hallux bilateral - both borders, notice the nails are curving inward, not sore, just concerned they may get worse  . New Patient (Initial Visit)    Est pt 40    54 y.o. female presents with the above complaint.   ROS: Denies fever chills nausea vomiting muscle aches pains calf pain back pain chest pain shortness of breath.  Past Medical History:  Diagnosis Date  . Asthma   . BPPV (benign paroxysmal positional vertigo), unspecified laterality    2-3 episodes/yr  . GERD (gastroesophageal reflux disease)    no current issues  . Herniated disc, cervical    no limitations  . Wears contact lenses    Past Surgical History:  Procedure Laterality Date  . BREAST BIOPSY Right 05/09/2020   stereo bx of calcs, x marker, path pending  . COLONOSCOPY    . COLONOSCOPY WITH PROPOFOL N/A 07/09/2016   Procedure: COLONOSCOPY WITH PROPOFOL;  Surgeon: Jonathon Bellows, MD;  Location: [facility];  Service: Endoscopy;  Laterality: N/A;  . TONSILLECTOMY    . UTERINE FIBROID SURGERY      Current Outpatient Medications:  .  albuterol (PROVENTIL HFA;VENTOLIN HFA) 108 (90 Base) MCG/ACT inhaler, Inhale into the lungs every 6 (six) hours as needed for wheezing or shortness of breath., Disp: , Rfl:  .  citalopram (CELEXA) 10 MG tablet, Take by mouth., Disp: , Rfl:  .  clonazePAM (KLONOPIN) 0.25 MG disintegrating tablet, Take by mouth., Disp: , Rfl:  .  memantine (NAMENDA) 5 MG tablet, Take by mouth at bedtime., Disp: , Rfl:  .  propranolol (INDERAL) 20 MG tablet, Take 20 mg by mouth 3 (three) times daily., Disp: , Rfl:   Allergies  Allergen Reactions  . Macrobid [Nitrofurantoin] Shortness Of Breath and Rash    Chest Pains  . Lexapro [Escitalopram Oxalate] Anxiety   Review of Systems Objective:  There were no vitals filed for this  visit.  General: Well developed, nourished, in no acute distress, alert and oriented x3   Dermatological: Skin is warm, dry and supple bilateral. Nails x 10 are well maintained; remaining integument appears unremarkable at this time. There are no open sores, no preulcerative lesions, no rash or signs of infection present.  Hallux nails bilaterally demonstrate onychocryptosis with raising of the central portion of the nail distally.  She also has some extension at the hallux interphalangeal joint which is resulting in her tenderness on the tip of the nail.  The nail is also a little bit longer.  There is no signs of infection and that there is no erythema edema cellulitis drainage or odor.  Vascular: Dorsalis Pedis artery and Posterior Tibial artery pedal pulses are 2/4 bilateral with immedate capillary fill time. Pedal hair growth present. No varicosities and no lower extremity edema present bilateral.   Neruologic: Grossly intact via light touch bilateral. Vibratory intact via tuning fork bilateral. Protective threshold with Semmes Wienstein monofilament intact to all pedal sites bilateral. Patellar and Achilles deep tendon reflexes 2+ bilateral. No Babinski or clonus noted bilateral.   Musculoskeletal: No gross boney pedal deformities bilateral. No pain, crepitus, or limitation noted with foot and ankle range of motion bilateral. Muscular strength 5/5 in all groups tested bilateral.  Gait: Unassisted, Nonantalgic.    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  Plan: She will follow up with me on an as-needed basis should this worsen lateral radiographs could be performed to make sure there were not dealing with any subungual exostoses but I highly recommended just keeping the nails cut short and follow-up in.     Apple Dearmas T. [place], [place]

## 2020-09-12 DIAGNOSIS — F3342 Major depressive disorder, recurrent, in full remission: Secondary | ICD-10-CM | POA: Diagnosis not present

## 2020-09-12 DIAGNOSIS — F411 Generalized anxiety disorder: Secondary | ICD-10-CM | POA: Diagnosis not present

## 2021-01-23 DIAGNOSIS — N3010 Interstitial cystitis (chronic) without hematuria: Secondary | ICD-10-CM | POA: Diagnosis not present

## 2021-01-23 DIAGNOSIS — R3129 Other microscopic hematuria: Secondary | ICD-10-CM | POA: Diagnosis not present

## 2021-04-10 DIAGNOSIS — F419 Anxiety disorder, unspecified: Secondary | ICD-10-CM | POA: Diagnosis not present

## 2021-04-10 DIAGNOSIS — R42 Dizziness and giddiness: Secondary | ICD-10-CM | POA: Diagnosis not present

## 2021-04-10 DIAGNOSIS — R251 Tremor, unspecified: Secondary | ICD-10-CM | POA: Diagnosis not present

## 2021-04-10 DIAGNOSIS — R413 Other amnesia: Secondary | ICD-10-CM | POA: Diagnosis not present

## 2021-05-15 ENCOUNTER — Ambulatory Visit (INDEPENDENT_AMBULATORY_CARE_PROVIDER_SITE_OTHER): Payer: BC Managed Care – PPO | Admitting: Obstetrics and Gynecology

## 2021-05-15 ENCOUNTER — Encounter: Payer: Self-pay | Admitting: Obstetrics and Gynecology

## 2021-05-15 ENCOUNTER — Other Ambulatory Visit: Payer: Self-pay

## 2021-05-15 VITALS — BP 122/76 | HR 63 | Ht 61.0 in | Wt 136.0 lb

## 2021-05-15 DIAGNOSIS — Z1239 Encounter for other screening for malignant neoplasm of breast: Secondary | ICD-10-CM

## 2021-05-15 DIAGNOSIS — Z01419 Encounter for gynecological examination (general) (routine) without abnormal findings: Secondary | ICD-10-CM

## 2021-05-15 NOTE — Patient Instructions (Signed)
[facility] [address] [place] [address]  [facility] [place]  [address] [place] [address]  Phone: [phone]  

## 2021-05-15 NOTE — Progress Notes (Signed)
Gynecology Annual Exam  PCP: Gayland Curry, MD  Chief Complaint:  Chief Complaint  Patient presents with   Gynecologic Exam    Annual - no concerns. RM 4    History of Present Illness:Patient is a 55 y.o. G2P0010 presents for annual exam. The patient has no complaints today.   LMP: No LMP recorded. Patient is postmenopausal. NO PMB  The patient is sexually active. She reports mild dyspareunia.  No significant vasomotor symptoms.  The patient does perform self breast exams.  There is no notable family history of breast or ovarian cancer in her family.  The patient wears seatbelts: yes.   The patient has regular exercise: not asked.    The patient denies current symptoms of depression.     Review of Systems: Review of Systems  Constitutional:  Negative for chills and fever.  HENT:  Negative for congestion.   Respiratory:  Negative for cough and shortness of breath.   Cardiovascular:  Negative for chest pain and palpitations.  Gastrointestinal:  Negative for abdominal pain, constipation, diarrhea, heartburn, nausea and vomiting.  Genitourinary:  Negative for dysuria, frequency and urgency.  Skin:  Negative for itching and rash.  Neurological:  Negative for dizziness and headaches.  Endo/Heme/Allergies:  Negative for polydipsia.  Psychiatric/Behavioral:  Negative for depression.    Past Medical History:  Patient Active Problem List   Diagnosis Date Noted   Dizziness 03/21/2020   Chest pain with low risk for cardiac etiology 05/17/2019   Heart palpitations 05/17/2019   LBBB (left bundle branch block) 05/17/2019   Brachial neuritis 05/16/2019   Carpal tunnel syndrome 05/16/2019   DDD (degenerative disc disease), cervical 05/16/2019   Lesion of ulnar nerve 05/16/2019   Neck pain 05/16/2019   Shoulder joint pain 05/16/2019   Spinal stenosis in cervical region 05/16/2019   Loss of memory 10/28/2018   Tremor 10/28/2018   High cholesterol 02/25/2018    Formatting of  this note might be different from the original. The 10-year ASCVD risk score Mikey Bussing DC Jr., et al., 2013) is: 0.9%-2019    Values used to calculate the score:     Age: 13 years     Sex: Female     Is Non-Hispanic African American: No     Diabetic: No     Tobacco smoker: No     Systolic Blood Pressure: 98 mmHg     Is BP treated: No     HDL Cholesterol: 87 mg/dL     Total Cholesterol: 264 mg/dL    Benign essential tremor 02/21/2016   Asthma without status asthmaticus 04/03/2015   Chronic cystitis 12/06/2014   Incomplete emptying of bladder 12/06/2014   Microscopic hematuria 12/06/2014   Solitary kidney, congenital 12/06/2014   Paresthesias 05/31/2012   Anxiety and depression 05/06/2011   GERD (gastroesophageal reflux disease) 05/06/2011   Encounter for counseling 11/07/2009    Formatting of this note might be different from the original.     Past Surgical History:  Past Surgical History:  Procedure Laterality Date   BREAST BIOPSY Right 05/09/2020   stereo bx of calcs, x marker, path pending   COLONOSCOPY     COLONOSCOPY WITH PROPOFOL N/A 07/09/2016   Procedure: COLONOSCOPY WITH PROPOFOL;  Surgeon: Jonathon Bellows, MD;  Location: [facility];  Service: Endoscopy;  Laterality: N/A;   TONSILLECTOMY     UTERINE FIBROID SURGERY      Gynecologic History:  No LMP recorded. Patient is postmenopausal. Last Pap: Results were: 1/6/2020NIL  and HR HPV negative  Last mammogram: 04/25/2020 Results were: BI-RADS IV norma bx  Obstetric History: G2P0010  Family History:  Family History  Problem Relation Age of Onset   Breast cancer Neg Hx     Social History:  Social History   Socioeconomic History   Marital status: Divorced    Spouse name: Not on file   Number of children: Not on file   Years of education: Not on file   Highest education level: Not on file  Occupational History   Not on file  Tobacco Use   Smoking status: Never   Smokeless tobacco: Never  Vaping Use    Vaping Use: Never used  Substance and Sexual Activity   Alcohol use: Yes    Alcohol/week: 1.0 standard drink    Types: 1 Cans of beer per week   Drug use: Never   Sexual activity: Yes    Birth control/protection: Post-menopausal  Other Topics Concern   Not on file  Social History Narrative   Not on file   Social Determinants of Health   Financial Resource Strain: Not on file  Food Insecurity: Not on file  Transportation Needs: Not on file  Physical Activity: Not on file  Stress: Not on file  Social Connections: Not on file  Intimate Partner Violence: Not on file    Allergies:  Allergies  Allergen Reactions   Macrobid [Nitrofurantoin] Shortness Of Breath and Rash    Chest Pains   Lexapro [Escitalopram Oxalate] Anxiety    Medications: Prior to Admission medications   Medication Sig Start Date End Date Taking? Authorizing Provider  citalopram (CELEXA) 10 MG tablet Take by mouth.   Yes [provider]  memantine (NAMENDA) 5 MG tablet Take by mouth at bedtime. 03/21/20  Yes [provider]  propranolol (INDERAL) 20 MG tablet Take 20 mg by mouth 3 (three) times daily.   Yes [provider]  albuterol (PROVENTIL HFA;VENTOLIN HFA) 108 (90 Base) MCG/ACT inhaler Inhale into the lungs every 6 (six) hours as needed for wheezing or shortness of breath.    [provider]  clonazePAM (KLONOPIN) 0.25 MG disintegrating tablet Take by mouth. 03/22/20   [provider]  doxycycline (VIBRAMYCIN) 100 MG capsule Take 100 mg by mouth daily as needed. 01/23/21   [provider]    Physical Exam Vitals: Blood pressure 122/76, pulse 63, height '5\' 1"'$  (1.549 m), weight 136 lb (61.7 kg).  General: NAD HEENT: normocephalic, anicteric Thyroid: no enlargement, no palpable nodules Pulmonary: No increased work of breathing, CTAB Cardiovascular: RRR, distal pulses 2+ Breast: Breast symmetrical, no tenderness, no palpable nodules or masses, no skin  or nipple retraction present, no nipple discharge.  No axillary or supraclavicular lymphadenopathy. Abdomen: NABS, soft, non-tender, non-distended.  Umbilicus without lesions.  No hepatomegaly, splenomegaly or masses palpable. No evidence of hernia  Genitourinary:  External: Normal external female genitalia.  Normal urethral meatus, normal Bartholin's and Skene's glands.    Vagina: Normal vaginal mucosa, no evidence of prolapse.    Cervix: Grossly normal in appearance, no bleeding  Uterus: Non-enlarged, mobile, normal contour.  No CMT  Adnexa: ovaries non-enlarged, no adnexal masses  Rectal: deferred  Lymphatic: no evidence of inguinal lymphadenopathy Extremities: no edema, erythema, or tenderness Neurologic: Grossly intact Psychiatric: mood appropriate, affect full  Female chaperone present for pelvic and breast  portions of the physical exam     Assessment: 55 y.o. G2P0010 routine annual exam  Plan: Problem List Items Addressed This Visit  None Visit Diagnoses     Encounter for gynecological examination without abnormal finding    -  Primary   Breast screening       Relevant Orders   MM 3D SCREEN BREAST BILATERAL       1) Mammogram - recommend yearly screening mammogram.  Mammogram Was ordered today  2) STI screening  was notoffered and therefore not obtained  3) ASCCP guidelines and rational discussed.  Patient opts for every 3 years screening interval  4) Osteoporosis  - per USPTF routine screening DEXA at age 52  5) Routine healthcare maintenance including cholesterol, diabetes screening discussed managed by PCP  6) Colonoscopy UTD 2017  7) Return in about 1 year (around 05/15/2022) for annual.    Malachy Mood, MD [place] Pigeon, [facility] 05/15/2021, 9:57 AM

## 2021-05-16 DIAGNOSIS — Z1231 Encounter for screening mammogram for malignant neoplasm of breast: Secondary | ICD-10-CM | POA: Diagnosis not present

## 2021-05-16 DIAGNOSIS — Z0000 Encounter for general adult medical examination without abnormal findings: Secondary | ICD-10-CM | POA: Diagnosis not present

## 2021-05-16 DIAGNOSIS — R198 Other specified symptoms and signs involving the digestive system and abdomen: Secondary | ICD-10-CM | POA: Diagnosis not present

## 2021-06-12 ENCOUNTER — Other Ambulatory Visit: Payer: Self-pay

## 2021-06-12 ENCOUNTER — Ambulatory Visit
Admission: RE | Admit: 2021-06-12 | Discharge: 2021-06-12 | Disposition: A | Discharge status: Home / Self Care | Payer: BC Managed Care – PPO | Source: Ambulatory Visit | Attending: Obstetrics and Gynecology | Admitting: Obstetrics and Gynecology

## 2021-06-12 DIAGNOSIS — Z1231 Encounter for screening mammogram for malignant neoplasm of breast: Secondary | ICD-10-CM | POA: Diagnosis not present

## 2021-06-12 DIAGNOSIS — Z1239 Encounter for other screening for malignant neoplasm of breast: Secondary | ICD-10-CM

## 2021-07-03 DIAGNOSIS — Z23 Encounter for immunization: Secondary | ICD-10-CM | POA: Diagnosis not present

## 2021-08-16 DIAGNOSIS — R0683 Snoring: Secondary | ICD-10-CM | POA: Diagnosis not present

## 2021-08-16 DIAGNOSIS — R4189 Other symptoms and signs involving cognitive functions and awareness: Secondary | ICD-10-CM | POA: Diagnosis not present

## 2021-09-26 DIAGNOSIS — F411 Generalized anxiety disorder: Secondary | ICD-10-CM | POA: Diagnosis not present

## 2021-10-09 DIAGNOSIS — F419 Anxiety disorder, unspecified: Secondary | ICD-10-CM | POA: Diagnosis not present

## 2021-10-09 DIAGNOSIS — R413 Other amnesia: Secondary | ICD-10-CM | POA: Diagnosis not present

## 2021-10-09 DIAGNOSIS — R4189 Other symptoms and signs involving cognitive functions and awareness: Secondary | ICD-10-CM | POA: Diagnosis not present

## 2021-10-09 DIAGNOSIS — R251 Tremor, unspecified: Secondary | ICD-10-CM | POA: Diagnosis not present

## 2021-10-15 DIAGNOSIS — Z23 Encounter for immunization: Secondary | ICD-10-CM | POA: Diagnosis not present

## 2021-10-30 DIAGNOSIS — K1370 Unspecified lesions of oral mucosa: Secondary | ICD-10-CM | POA: Diagnosis not present

## 2021-10-30 DIAGNOSIS — Z1231 Encounter for screening mammogram for malignant neoplasm of breast: Secondary | ICD-10-CM | POA: Diagnosis not present

## 2021-10-30 DIAGNOSIS — K219 Gastro-esophageal reflux disease without esophagitis: Secondary | ICD-10-CM | POA: Diagnosis not present

## 2021-11-19 ENCOUNTER — Encounter: Payer: Self-pay | Admitting: Internal Medicine

## 2021-11-19 ENCOUNTER — Other Ambulatory Visit: Payer: Self-pay

## 2021-11-19 ENCOUNTER — Ambulatory Visit (INDEPENDENT_AMBULATORY_CARE_PROVIDER_SITE_OTHER): Payer: BC Managed Care – PPO | Admitting: Internal Medicine

## 2021-11-19 VITALS — BP 132/82 | HR 63 | Temp 97.1°F | Ht 61.0 in | Wt 136.0 lb

## 2021-11-19 DIAGNOSIS — K219 Gastro-esophageal reflux disease without esophagitis: Secondary | ICD-10-CM | POA: Diagnosis not present

## 2021-11-19 DIAGNOSIS — R7309 Other abnormal glucose: Secondary | ICD-10-CM | POA: Diagnosis not present

## 2021-11-19 DIAGNOSIS — F32A Depression, unspecified: Secondary | ICD-10-CM | POA: Diagnosis not present

## 2021-11-19 DIAGNOSIS — Z6825 Body mass index (BMI) 25.0-25.9, adult: Secondary | ICD-10-CM

## 2021-11-19 DIAGNOSIS — N3020 Other chronic cystitis without hematuria: Secondary | ICD-10-CM | POA: Diagnosis not present

## 2021-11-19 DIAGNOSIS — M5030 Other cervical disc degeneration, unspecified cervical region: Secondary | ICD-10-CM

## 2021-11-19 DIAGNOSIS — G250 Essential tremor: Secondary | ICD-10-CM | POA: Diagnosis not present

## 2021-11-19 DIAGNOSIS — H8110 Benign paroxysmal vertigo, unspecified ear: Secondary | ICD-10-CM | POA: Insufficient documentation

## 2021-11-19 DIAGNOSIS — E7800 Pure hypercholesterolemia, unspecified: Secondary | ICD-10-CM

## 2021-11-19 DIAGNOSIS — Z6826 Body mass index (BMI) 26.0-26.9, adult: Secondary | ICD-10-CM | POA: Insufficient documentation

## 2021-11-19 DIAGNOSIS — E663 Overweight: Secondary | ICD-10-CM

## 2021-11-19 DIAGNOSIS — F419 Anxiety disorder, unspecified: Secondary | ICD-10-CM

## 2021-11-19 DIAGNOSIS — H8113 Benign paroxysmal vertigo, bilateral: Secondary | ICD-10-CM

## 2021-11-19 DIAGNOSIS — J4520 Mild intermittent asthma, uncomplicated: Secondary | ICD-10-CM

## 2021-11-19 DIAGNOSIS — R682 Dry mouth, unspecified: Secondary | ICD-10-CM | POA: Diagnosis not present

## 2021-11-19 MED ORDER — DEXLANSOPRAZOLE 30 MG PO CPDR: 30.0000 mg | DELAYED_RELEASE_CAPSULE | Freq: Every day | ORAL | 1 refills | Status: DC

## 2021-11-19 NOTE — Assessment & Plan Note (Signed)
Takes Doxycycline as needed ?

## 2021-11-19 NOTE — Progress Notes (Signed)
HPI ? ?Pt presents to the clinic today to establish care and for management of the conditions listed below. ? ?Asthma: Mild, intermittent. She uses Albuterol as needed with good relief of symptoms. There are no PFT's on file. ? ?GERD with Hx of Stricture: Worsened in the last 3 months, she is having dysphagia. She is taking Prilosec but reports this has not been effective. There is no upper GI on file.  ? ?HLD: Her last LDL was 164, triglycerides 78, 03/2020. She is not taking any cholesterol lowering medication. She tries to consume a low fat diet. ? ?Chronic Neck Pain: Secondary to an accident years ago. She does not take anything OTC for this. She intermittently follows with orthopedics. ? ?Essential Tremor: Managed on Propranolol. She is following with neurology. ? ?Anxiety and Depression: Chronic, managed on Citalopram, Clonazepam. She is not currently seeing a therapist but she is seeing psychiatry. She denies SI/HI. ? ?Interstitial Cystitis: She reports mainly frequency and urgency. She controls this with her diet but does take Doxycyline as needed. She is not currently seeing a urologist. ? ?BPPV: Intermittent. She does the Epley maneuver as needed with good results. ? ?Past Medical History:  ?Diagnosis Date  ? Asthma   ? BPPV (benign paroxysmal positional vertigo), unspecified laterality   ? 2-3 episodes/yr  ? GERD (gastroesophageal reflux disease)   ? no current issues  ? Herniated disc, cervical   ? no limitations  ? Wears contact lenses   ? ? ?Current Outpatient Medications  ?Medication Sig Dispense Refill  ? albuterol (PROVENTIL HFA;VENTOLIN HFA) 108 (90 Base) MCG/ACT inhaler Inhale into the lungs every 6 (six) hours as needed for wheezing or shortness of breath.    ? citalopram (CELEXA) 10 MG tablet Take by mouth.    ? clonazePAM (KLONOPIN) 0.25 MG disintegrating tablet Take by mouth.    ? doxycycline (VIBRAMYCIN) 100 MG capsule Take 100 mg by mouth daily as needed.    ? memantine (NAMENDA) 5 MG tablet  Take by mouth at bedtime.    ? propranolol (INDERAL) 20 MG tablet Take 20 mg by mouth 3 (three) times daily.    ? ?No current facility-administered medications for this visit.  ? ? ?Allergies  ?Allergen Reactions  ? Macrobid [Nitrofurantoin] Shortness Of Breath and Rash  ?  Chest Pains  ? Lexapro [Escitalopram Oxalate] Anxiety  ? ? ?Family History  ?Problem Relation Age of Onset  ? Breast cancer Neg Hx   ? ? ?Social History  ? ?Socioeconomic History  ? Marital status: Divorced  ?  Spouse name: Not on file  ? Number of children: Not on file  ? Years of education: Not on file  ? Highest education level: Not on file  ?Occupational History  ? Not on file  ?Tobacco Use  ? Smoking status: Never  ? Smokeless tobacco: Never  ?Vaping Use  ? Vaping Use: Never used  ?Substance and Sexual Activity  ? Alcohol use: Yes  ?  Alcohol/week: 1.0 standard drink  ?  Types: 1 Cans of beer per week  ? Drug use: Never  ? Sexual activity: Yes  ?  Birth control/protection: Post-menopausal  ?Other Topics Concern  ? Not on file  ?Social History Narrative  ? Not on file  ? ?Social Determinants of Health  ? ?Financial Resource Strain: Not on file  ?Food Insecurity: Not on file  ?Transportation Needs: Not on file  ?Physical Activity: Not on file  ?Stress: Not on file  ?Social Connections: Not on  file  ?Intimate Partner Violence: Not on file  ? ? ?ROS: ? ?Constitutional: Denies fever, malaise, fatigue, headache or abrupt weight changes.  ?HEENT: Pt reports dry mouth. Denies eye pain, eye redness, ear pain, ringing in the ears, wax buildup, runny nose, nasal congestion, bloody nose, or sore throat. ?Respiratory: Denies difficulty breathing, shortness of breath, cough or sputum production.   ?Cardiovascular: Denies chest pain, chest tightness, palpitations or swelling in the hands or feet.  ?Gastrointestinal: Denies abdominal pain, bloating, constipation, diarrhea or blood in the stool.  ?GU: Pt reports urinary urgency and frequency. Denies pain  with urination, blood in urine, odor or discharge. ?Musculoskeletal: Pt reports chronic neck pain. Denies decrease in range of motion, difficulty with gait, or joint swelling.  ?Skin: Denies redness, rashes, lesions or ulcercations.  ?Neurological: Pt reports essential tremor. Denies dizziness, difficulty with memory, difficulty with speech or problems with balance and coordination.  ?Psych: Pt has a history of anxiety and depression. Denies SI/HI. ? ?No other specific complaints in a complete review of systems (except as listed in HPI above). ? ?PE: ? ?BP 132/82 (BP Location: Right Arm, Patient Position: Sitting, Cuff Size: Normal)   Pulse 63   Temp (!) 97.1 ?F (36.2 ?C) (Temporal)   Ht '5\' 1"'$  (1.549 m)   Wt 136 lb (61.7 kg)   SpO2 97%   BMI 25.70 kg/m?  ? ?Wt Readings from Last 3 Encounters:  ?05/15/21 136 lb (61.7 kg)  ?09/06/18 133 lb (60.3 kg)  ?07/09/16 124 lb (56.2 kg)  ? ? ?General: Appears her stated age, overweight, in NAD. ?HEENT: Head: normal shape and size; Eyes: sclera white, no icterus, conjunctiva pink, PERRLA and EOMs intact; Throat/Mouth: Teeth present, mucosa pink and moist, no lesions or ulcerations noted.  ?Neck: Neck supple, trachea midline. No masses, lumps or thyromegaly present.  ?Cardiovascular: Normal rate and rhythm. S1,S2 noted.  No murmur, rubs or gallops noted. No JVD or BLE edema. No carotid bruits noted. ?Pulmonary/Chest: Normal effort and positive vesicular breath sounds. No respiratory distress. No wheezes, rales or ronchi noted.  ?Abdomen: Soft and nontender. Normal bowel sounds. ?Musculoskeletal: No difficulty with gait.  ?Neurological: Alert and oriented. Resting tremor noted of left hand. Coordination normal.  ?Psychiatric: Mood and affect normal. Behavior is normal. Judgment and thought content normal.  ? ?BMET ?   ?Component Value Date/Time  ? NA 139 04/30/2012 0838  ? K 4.1 04/30/2012 0838  ? CL 107 04/30/2012 0838  ? CO2 23 04/30/2012 0838  ? GLUCOSE 133 (H) 04/30/2012  7672  ? BUN 14 04/30/2012 0838  ? CREATININE 0.79 04/30/2012 0838  ? CALCIUM 9.1 04/30/2012 0838  ? GFRNONAA >60 04/30/2012 0947  ? GFRAA >60 04/30/2012 0838  ? ? ?Lipid Panel  ?No results found for: CHOL, TRIG, HDL, CHOLHDL, VLDL, LDLCALC ? ?CBC ?   ?Component Value Date/Time  ? WBC 5.0 04/30/2012 0838  ? RBC 4.46 04/30/2012 0838  ? HGB 14.9 04/30/2012 0838  ? HCT 42.5 04/30/2012 0838  ? PLT 300 04/30/2012 0838  ? MCV 95 04/30/2012 0838  ? MCH 33.4 04/30/2012 0838  ? MCHC 35.1 04/30/2012 0838  ? RDW 12.9 04/30/2012 0838  ? ? ?Hgb A1C ?No results found for: HGBA1C ? ? ?Assessment and Plan: ? ?Dry Mouth: ? ?Will check TSH, Sjogren's antibodies and A1C ? ?RTC in 6 months for your annual exam ? ? ?Webb Silversmith, NP ?This visit occurred during the SARS-CoV-2 public health emergency.  Safety protocols were in place, including  screening questions prior to the visit, additional usage of staff PPE, and extensive cleaning of exam room while observing appropriate contact time as indicated for disinfecting solutions.  ? ?

## 2021-11-19 NOTE — Assessment & Plan Note (Signed)
CMET and lipid profile today Encouraged her to consume a low fat diet 

## 2021-11-19 NOTE — Assessment & Plan Note (Signed)
Deteriorated ?D/C Prilosec ?RX for Dexilatn 30 mg daily ?Referral to GI for upper GI ?

## 2021-11-19 NOTE — Assessment & Plan Note (Signed)
Managed without meds ?She follows with orthopedics intermittently ?

## 2021-11-19 NOTE — Assessment & Plan Note (Signed)
Managed on Propranolol ?

## 2021-11-19 NOTE — Assessment & Plan Note (Signed)
Encouraged diet and exercise for weight loss ?

## 2021-11-19 NOTE — Assessment & Plan Note (Signed)
Continue Albuterol as needed 

## 2021-11-19 NOTE — Assessment & Plan Note (Signed)
Continue Epley Maneuver as needed ?

## 2021-11-19 NOTE — Assessment & Plan Note (Signed)
Managed on Citalopram, Clonazepam per psychiatry ?Support offered ?

## 2021-11-19 NOTE — Patient Instructions (Signed)
Benzocaine mouth gel, ointment, solution, or dental paste ?What is this medication? ?BENZOCAINE (BEN zoe kane) causes loss of feeling on the skin and in the mouth. This helps relieve mouth pain. ?This medicine may be used for other purposes; ask your health care provider or pharmacist if you have questions. ?COMMON BRAND NAME(S): Anbesol, Anbesol Baby, [employer], Benz-O-Sthetic, Benzodent, Boil-Ease, Comfort Vella Redhead, [employer] Socket Remedy, [place], Little Remedies for [employer], Orabase, Orajel, Orajel Baby, Orajel Denture Plus, Orajel Maximum Strength, Orajel Protective, Orajel Severe Pain, Orajel Swabs, Oral Pain Relief, Pro-Caine, Topicale Xtra, Zilactin-B ?What should I tell my care team before I take this medication? ?They need to know if you have any of these conditions: ?cigarette smoker ?G6PD deficiency ?heart disease ?lung or breathing disease, like asthma ?mouth sores or infection ?an unusual or allergic reaction to benzocaine, para-aminobenzoic acid (PABA), other medicines, foods, dyes, or preservatives ?pregnant or trying to get pregnant ?breast-feeding ?How should I use this medication? ?This medicine is applied to the affected area of the mouth or gums. Wash your hands before and after using this medicine. Follow the directions on the label or those given to you by your doctor or health care professional. Do not use this medicine more often than directed. ?Talk to your pediatrician regarding the use of this medicine in children. While this medicine may be used in children as young as 2 years for selected conditions, precautions do apply. This medicine should not be used for teething pain. ?Overdosage: If you think you have taken too much of this medicine contact a poison control center or emergency room at once. ?NOTE: This medicine is only for you. Do not share this medicine with others. ?What if I miss a dose? ?This does not apply. ?What may interact with this medication? ?sulfonamides like  sulfacetamide, sulfamethoxazole, sulfisoxazole, and others ?This list may not describe all possible interactions. Give your health care provider a list of all the medicines, herbs, non-prescription drugs, or dietary supplements you use. Also tell them if you smoke, drink alcohol, or use illegal drugs. Some items may interact with your medicine. ?What should I watch for while using this medication? ?This medicine is not for long-term use. Do not use for longer than directed on the label or your doctor or health care professional. Do not use on large areas of broken or damaged skin. Contact your doctor or health care professional if your condition does not start to get better within a few days or if you notice redness, itching or swelling. ?The affected area of your mouth will be numb. Try to avoid injury to that area. To avoid biting the tongue or cheek, or difficulty swallowing, do not chew gum or food until the numbness wears off. ?What side effects may I notice from receiving this medication? ?Side effects that you should report to your doctor or health care professional as soon as possible: ?allergic reactions like skin rash, itching or hives, swelling of the face, lips, or tongue ?breathing problems ?confusion ?dizziness ?fast, irregular heartbeat ?headache ?pale, gray, or blue-colored skin, lips, or nail beds ?seizures ?tiredness ?tremor ?Side effects that usually do not require medical attention (report to your doctor or health care professional if they continue or are bothersome): ?redness, swelling, or pain ?This list may not describe all possible side effects. Call your doctor for medical advice about side effects. You may report side effects to FDA at 1-800-FDA-1088. ?Where should I keep my medication? ?Keep out of the reach of children. ?Store at  room temperature between 15 and 30 degrees C (59 and 86 degrees F). Do not freeze. Throw away any unused medicine after the expiration date. ?NOTE: This sheet is  a summary. It may not cover all possible information. If you have questions about this medicine, talk to your doctor, pharmacist, or health care provider. ?? 2022 Elsevier/Gold Standard (2017-01-22 00:00:00) ? ?

## 2021-11-20 ENCOUNTER — Telehealth: Payer: Self-pay

## 2021-11-20 LAB — COMPLETE METABOLIC PANEL WITH GFR
AG Ratio: 2.2 (calc) (ref 1.0–2.5)
ALT: 14 U/L (ref 6–29)
AST: 16 U/L (ref 10–35)
Albumin: 4.6 g/dL (ref 3.6–5.1)
Alkaline phosphatase (APISO): 68 U/L (ref 37–153)
BUN: 19 mg/dL (ref 7–25)
CO2: 21 mmol/L (ref 20–32)
Calcium: 9.6 mg/dL (ref 8.6–10.4)
Chloride: 108 mmol/L (ref 98–110)
Creat: 0.85 mg/dL (ref 0.50–1.03)
Globulin: 2.1 g/dL (calc) (ref 1.9–3.7)
Glucose, Bld: 91 mg/dL (ref 65–139)
Potassium: 4.3 mmol/L (ref 3.5–5.3)
Sodium: 141 mmol/L (ref 135–146)
Total Bilirubin: 0.7 mg/dL (ref 0.2–1.2)
Total Protein: 6.7 g/dL (ref 6.1–8.1)
eGFR: 79 mL/min/{1.73_m2} (ref >=60)

## 2021-11-20 LAB — CBC
HCT: 40.2 % (ref 35.0–45.0)
Hemoglobin: 13.2 g/dL (ref 11.7–15.5)
MCH: 31.2 pg (ref 27.0–33.0)
MCHC: 32.8 g/dL (ref 32.0–36.0)
MCV: 95 fL (ref 80.0–100.0)
MPV: 10.7 fL (ref 7.5–12.5)
Platelets: 266 10*3/uL (ref 140–400)
RBC: 4.23 10*6/uL (ref 3.80–5.10)
RDW: 12.3 % (ref 11.0–15.0)
WBC: 4.5 10*3/uL (ref 3.8–10.8)

## 2021-11-20 LAB — LIPID PANEL
Cholesterol: 285 mg/dL — ABNORMAL HIGH (ref <200)
HDL: 80 mg/dL (ref >=50)
LDL Cholesterol (Calc): 180 mg/dL (calc) — ABNORMAL HIGH
Non-HDL Cholesterol (Calc): 205 mg/dL (calc) — ABNORMAL HIGH (ref <130)
Total CHOL/HDL Ratio: 3.6 (calc) (ref <5.0)
Triglycerides: 118 mg/dL (ref <150)

## 2021-11-20 LAB — SJOGREN'S SYNDROME ANTIBODS(SSA + SSB)
SSA (Ro) (ENA) Antibody, IgG: <1 AI
SSB (La) (ENA) Antibody, IgG: <1 AI

## 2021-11-20 LAB — HEMOGLOBIN A1C
Hgb A1c MFr Bld: 5.1 % of total Hgb (ref <5.7)
Mean Plasma Glucose: 100 mg/dL
eAG (mmol/L): 5.5 mmol/L

## 2021-11-20 LAB — TSH: TSH: 1.89 mIU/L (ref 0.40–4.50)

## 2021-11-20 NOTE — Telephone Encounter (Signed)
CALLED PATIENT NO ANSWER LEFT VOICEMAIL FOR A CALL BACK ?Seen anna in 2017 ?

## 2021-11-21 ENCOUNTER — Telehealth: Payer: Self-pay

## 2021-11-21 MED ORDER — ATORVASTATIN CALCIUM 10 MG PO TABS: 10.0000 mg | ORAL_TABLET | Freq: Every day | ORAL | 2 refills | Status: DC

## 2021-11-21 NOTE — Telephone Encounter (Signed)
-----  Message from Jearld Fenton, NP sent at 11/21/2021  6:12 AM EDT ----- ?Blood counts are normal.  Thyroid function is normal.  You do not have diabetes.  Liver and kidney function is normal.  Cholesterol is elevated.  I would recommend cholesterol-lowering medication.  If she is agreeable, send in atorvastatin 10 mg daily, #30, 2 refills.  She does not have Sjogren's, disease that causes dry mouth.  She will need repeat lipid and c-Met in 3 months, lab only. ?

## 2021-11-21 NOTE — Telephone Encounter (Signed)
Pt advised.  Rx sent to [facility] in [place].  Apt schedule for 02/25/2022 a 9am.  ? ?Thanks,  ? ?-Mickel Baas  ?

## 2021-12-17 DIAGNOSIS — Z23 Encounter for immunization: Secondary | ICD-10-CM | POA: Diagnosis not present

## 2022-01-13 ENCOUNTER — Ambulatory Visit (INDEPENDENT_AMBULATORY_CARE_PROVIDER_SITE_OTHER): Payer: BC Managed Care – PPO | Admitting: Gastroenterology

## 2022-01-13 ENCOUNTER — Encounter: Payer: Self-pay | Admitting: Gastroenterology

## 2022-01-13 VITALS — BP 111/74 | HR 66 | Temp 98.3°F | Ht 61.0 in | Wt 136.0 lb

## 2022-01-13 DIAGNOSIS — R1310 Dysphagia, unspecified: Secondary | ICD-10-CM

## 2022-01-13 DIAGNOSIS — R194 Change in bowel habit: Secondary | ICD-10-CM | POA: Diagnosis not present

## 2022-01-13 MED ORDER — NA SULFATE-K SULFATE-MG SULF 17.5-3.13-1.6 GM/177ML PO SOLN
1.0000 | Freq: Once | ORAL | 0 refills | Status: AC
Start: 2022-01-13 — End: 2022-01-13

## 2022-01-13 NOTE — Progress Notes (Signed)
? ? ?Gastroenterology Consultation ? ?Referring Provider:     Jearld Fenton, NP ?Primary Care Physician:  Jearld Fenton, NP ?Primary Gastroenterologist:  Dr. Allen Norris     ?Reason for Consultation:     Nausea for the last month and dysphagia with GERD and change in bowel habits. ?      ? HPI:   ?Evelyn Choi is a 56 y.o. y/o female referred for consultation & management of nausea and dysphagia and GERD by Dr. Garnette Gunner, Coralie Keens, NP.  This patient comes in today with a history of having an upper endoscopy at [facility] internal medicine in the past for dysphagia with dilation of her esophagus at that time.  The patient also had a colonoscopy in 2017 by Dr. Vicente Males for a first-degree relative with a history of polyps before the age of 68.  The patient now comes in and states that she has been having some increased burping usually when the food gets stuck and has had heartburn with intermittent nausea.  The patient also reports that she has had a change in bowel habits with her usually being constipated but now she has been having diarrhea for the last 6 months.  There is no report of any black stools although she states that she has some bright red blood per rectum intermittently but thinks it is her hemorrhoids.  The patient also has some abdominal pain that she states started in her left upper quadrant but now is across the top of her abdomen on both sides. ? ?Past Medical History:  ?Diagnosis Date  ? Asthma   ? BPPV (benign paroxysmal positional vertigo), unspecified laterality   ? 2-3 episodes/yr  ? GERD (gastroesophageal reflux disease)   ? no current issues  ? Herniated disc, cervical   ? no limitations  ? Wears contact lenses   ? ? ?Past Surgical History:  ?Procedure Laterality Date  ? BREAST BIOPSY Right 05/09/2020  ? stereo bx of calcs, x marker, path pending  ? COLONOSCOPY    ? COLONOSCOPY WITH PROPOFOL N/A 07/09/2016  ? Procedure: COLONOSCOPY WITH PROPOFOL;  Surgeon: Jonathon Bellows, MD;  Location: [facility];  Service: Endoscopy;  Laterality: N/A;  ? TONSILLECTOMY    ? UTERINE FIBROID SURGERY    ? ? ?Prior to Admission medications   ?Medication Sig Start Date End Date Taking? Authorizing Provider  ?albuterol (PROVENTIL HFA;VENTOLIN HFA) 108 (90 Base) MCG/ACT inhaler Inhale into the lungs every 6 (six) hours as needed for wheezing or shortness of breath.    [provider]  ?atorvastatin (LIPITOR) 10 MG tablet Take 1 tablet (10 mg total) by mouth daily. 11/21/21   Jearld Fenton, NP  ?citalopram (CELEXA) 10 MG tablet Take by mouth.    [provider]  ?clonazePAM (KLONOPIN) 0.25 MG disintegrating tablet Take by mouth. 03/22/20   [provider]  ?Dexlansoprazole 30 MG capsule DR Take 1 capsule (30 mg total) by mouth daily. 11/19/21   Jearld Fenton, NP  ?doxycycline (VIBRAMYCIN) 100 MG capsule Take 100 mg by mouth daily as needed. 01/23/21   [provider]  ?memantine (NAMENDA) 5 MG tablet Take by mouth at bedtime. 03/21/20   [provider]  ?propranolol (INDERAL) 20 MG tablet Take 20 mg by mouth 3 (three) times daily.    [provider]  ? ? ?Family History  ?Problem Relation Age of Onset  ? Hypertension Mother   ? Depression Father   ? Hypothyroidism Father   ?  Healthy Sister   ? Breast cancer Neg Hx   ? Colon cancer Neg Hx   ?  ? ?Social History  ? ?Tobacco Use  ? Smoking status: Never  ? Smokeless tobacco: Never  ?Vaping Use  ? Vaping Use: Never used  ?Substance Use Topics  ? Alcohol use: Yes  ?  Alcohol/week: 1.0 standard drink  ?  Types: 1 Cans of beer per week  ? Drug use: Never  ? ? ?Allergies as of 01/13/2022 - Review Complete 11/19/2021  ?Allergen Reaction Noted  ? Macrobid [nitrofurantoin] Shortness Of Breath and Rash 06/25/2016  ? Lexapro [escitalopram oxalate] Anxiety 06/23/2012  ? ? ?Review of Systems:    ?All systems reviewed and negative except where noted in HPI. ? ? Physical Exam:  ?There were no vitals taken for this visit. ?No LMP recorded.  Patient is postmenopausal. ?General:   Alert,  Well-developed, well-nourished, pleasant and cooperative in NAD ?Head:  Normocephalic and atraumatic. ?Eyes:  Sclera clear, no icterus.   Conjunctiva pink. ?Ears:  Normal auditory acuity. ?Neck:  Supple; no masses or thyromegaly. ?Lungs:  Respirations even and unlabored.  Clear throughout to auscultation.   No wheezes, crackles, or rhonchi. No acute distress. ?Heart:  Regular rate and rhythm; no murmurs, clicks, rubs, or gallops. ?Abdomen:  Normal bowel sounds.  No bruits.  Soft, non-tender and non-distended without masses, hepatosplenomegaly or hernias noted.  No guarding or rebound tenderness.  Negative Carnett sign.   ?Rectal:  Deferred.  ?Pulses:  Normal pulses noted. ?Extremities:  No clubbing positive lower extremity edema.  No cyanosis. ?Neurologic:  Alert and oriented x3;  grossly normal neurologically. ?Skin:  Intact without significant lesions or rashes.  No jaundice. ?Lymph Nodes:  No significant cervical adenopathy. ?Psych:  Alert and cooperative. Normal mood and affect. ? ?Imaging Studies: ?No results found. ? ?Assessment and Plan:  ? ?Evelyn Choi is a 56 y.o. y/o female who comes in today with a history of dysphagia with GERD and has had dilation of her esophagus in the past.  The patient also has been having a change in bowel habits with worsening diarrhea in the last 6 months.  The patient will be set up for an EGD and colonoscopy due to the above symptoms.  The patient has been explained the plan and agrees with it.  She will follow-up at the time of the procedures. ? ? ? ?Lucilla Lame, MD. Marval Regal ? ? ? Note: This dictation was prepared with Dragon dictation along with smaller phrase technology. Any transcriptional errors that result from this process are unintentional.   ?

## 2022-01-15 DIAGNOSIS — R251 Tremor, unspecified: Secondary | ICD-10-CM | POA: Diagnosis not present

## 2022-01-15 DIAGNOSIS — R4189 Other symptoms and signs involving cognitive functions and awareness: Secondary | ICD-10-CM | POA: Diagnosis not present

## 2022-01-15 DIAGNOSIS — R42 Dizziness and giddiness: Secondary | ICD-10-CM | POA: Diagnosis not present

## 2022-01-15 DIAGNOSIS — F419 Anxiety disorder, unspecified: Secondary | ICD-10-CM | POA: Diagnosis not present

## 2022-02-03 ENCOUNTER — Encounter: Payer: Self-pay | Admitting: Gastroenterology

## 2022-02-07 ENCOUNTER — Encounter: Payer: Self-pay | Admitting: Gastroenterology

## 2022-02-07 ENCOUNTER — Other Ambulatory Visit: Payer: Self-pay

## 2022-02-07 ENCOUNTER — Ambulatory Visit: Payer: BC Managed Care – PPO | Admitting: Anesthesiology

## 2022-02-07 ENCOUNTER — Encounter
Admission: RE | Disposition: A | Discharge status: Home / Self Care | Payer: Self-pay | Source: Home / Self Care | Attending: Gastroenterology

## 2022-02-07 ENCOUNTER — Ambulatory Visit
Admission: RE | Admit: 2022-02-07 | Discharge: 2022-02-07 | Disposition: A | Discharge status: Home / Self Care | Payer: BC Managed Care – PPO | Attending: Gastroenterology | Admitting: Gastroenterology

## 2022-02-07 DIAGNOSIS — R599 Enlarged lymph nodes, unspecified: Secondary | ICD-10-CM | POA: Insufficient documentation

## 2022-02-07 DIAGNOSIS — K219 Gastro-esophageal reflux disease without esophagitis: Secondary | ICD-10-CM | POA: Diagnosis not present

## 2022-02-07 DIAGNOSIS — J45909 Unspecified asthma, uncomplicated: Secondary | ICD-10-CM | POA: Diagnosis not present

## 2022-02-07 DIAGNOSIS — F419 Anxiety disorder, unspecified: Secondary | ICD-10-CM | POA: Insufficient documentation

## 2022-02-07 DIAGNOSIS — R1310 Dysphagia, unspecified: Secondary | ICD-10-CM

## 2022-02-07 DIAGNOSIS — K529 Noninfective gastroenteritis and colitis, unspecified: Secondary | ICD-10-CM | POA: Diagnosis not present

## 2022-02-07 DIAGNOSIS — Z6825 Body mass index (BMI) 25.0-25.9, adult: Secondary | ICD-10-CM

## 2022-02-07 DIAGNOSIS — F32A Depression, unspecified: Secondary | ICD-10-CM | POA: Insufficient documentation

## 2022-02-07 DIAGNOSIS — R194 Change in bowel habit: Secondary | ICD-10-CM | POA: Diagnosis not present

## 2022-02-07 HISTORY — PX: ESOPHAGOGASTRODUODENOSCOPY: SHX5428

## 2022-02-07 HISTORY — PX: COLONOSCOPY: SHX5424

## 2022-02-07 SURGERY — COLONOSCOPY
Anesthesia: General | Site: Rectum

## 2022-02-07 MED ORDER — LIDOCAINE HCL (CARDIAC) PF 100 MG/5ML IV SOSY
PREFILLED_SYRINGE | INTRAVENOUS | Status: DC | PRN
  Administered 2022-02-07: 60 mg via INTRAVENOUS

## 2022-02-07 MED ORDER — STERILE WATER FOR IRRIGATION IR SOLN
Status: DC | PRN
  Administered 2022-02-07: 60 mL

## 2022-02-07 MED ORDER — STERILE WATER FOR IRRIGATION IR SOLN
Status: DC | PRN
  Administered 2022-02-07: 150 mL

## 2022-02-07 MED ORDER — ONDANSETRON HCL 4 MG/2ML IJ SOLN
4.0000 mg | Freq: Once | INTRAMUSCULAR | Status: DC | PRN
Start: 2022-02-07 — End: 2022-02-07

## 2022-02-07 MED ORDER — ACETAMINOPHEN 160 MG/5ML PO SOLN: 325.0000 mg | ORAL | Status: DC | PRN

## 2022-02-07 MED ORDER — LACTATED RINGERS IV SOLN: INTRAVENOUS | Status: DC

## 2022-02-07 MED ORDER — ACETAMINOPHEN 325 MG PO TABS: 650.0000 mg | ORAL_TABLET | ORAL | Status: DC | PRN

## 2022-02-07 MED ORDER — SODIUM CHLORIDE 0.9 % IV SOLN: INTRAVENOUS | Status: DC

## 2022-02-07 MED ORDER — PROPOFOL 10 MG/ML IV BOLUS
INTRAVENOUS | Status: DC | PRN
  Administered 2022-02-07 (×3): 50 mg via INTRAVENOUS
  Administered 2022-02-07: 80 mg via INTRAVENOUS
  Administered 2022-02-07: 20 mg via INTRAVENOUS
  Administered 2022-02-07 (×2): 50 mg via INTRAVENOUS

## 2022-02-07 SURGICAL SUPPLY — 38 items
BALLN DILATOR 10-12 8 (BALLOONS)
BALLN DILATOR 12-15 8 (BALLOONS)
BALLN DILATOR 15-18 8 (BALLOONS) ×4
BALLN DILATOR CRE 0-12 8 (BALLOONS)
BALLN DILATOR ESOPH 8 10 CRE (MISCELLANEOUS) IMPLANT
BALLOON DILATOR 12-15 8 (BALLOONS) IMPLANT
BALLOON DILATOR 15-18 8 (BALLOONS) IMPLANT
BALLOON DILATOR CRE 0-12 8 (BALLOONS) IMPLANT
BLOCK BITE 60FR ADLT L/F GRN (MISCELLANEOUS) ×4 IMPLANT
CLIP HMST 235XBRD CATH ROT (MISCELLANEOUS) IMPLANT
CLIP RESOLUTION 360 11X235 (MISCELLANEOUS)
ELECT REM PT RETURN 9FT ADLT (ELECTROSURGICAL)
ELECTRODE REM PT RTRN 9FT ADLT (ELECTROSURGICAL) IMPLANT
FCP ESCP3.2XJMB 240X2.8X (MISCELLANEOUS)
FORCEPS BIOP RAD 4 LRG CAP 4 (CUTTING FORCEPS) ×1 IMPLANT
FORCEPS BIOP RJ4 240 W/NDL (MISCELLANEOUS)
FORCEPS ESCP3.2XJMB 240X2.8X (MISCELLANEOUS) IMPLANT
GOWN CVR UNV OPN BCK APRN NK (MISCELLANEOUS) ×12 IMPLANT
GOWN ISOL THUMB LOOP REG UNIV (MISCELLANEOUS) ×16
INJECTOR VARIJECT VIN23 (MISCELLANEOUS) IMPLANT
KIT DEFENDO VALVE AND CONN (KITS) IMPLANT
KIT PRC NS LF DISP ENDO (KITS) ×6 IMPLANT
KIT PROCEDURE OLYMPUS (KITS) ×8
MANIFOLD NEPTUNE II (INSTRUMENTS) ×8 IMPLANT
MARKER SPOT ENDO TATTOO 5ML (MISCELLANEOUS) IMPLANT
PROBE APC STR FIRE (PROBE) IMPLANT
RETRIEVER NET PLAT FOOD (MISCELLANEOUS) IMPLANT
RETRIEVER NET ROTH 2.5X230 LF (MISCELLANEOUS) IMPLANT
SNARE COLD EXACTO (MISCELLANEOUS) IMPLANT
SNARE SHORT THROW 13M SML OVAL (MISCELLANEOUS) IMPLANT
SNARE SHORT THROW 30M LRG OVAL (MISCELLANEOUS) IMPLANT
SNARE SNG USE RND 15MM (INSTRUMENTS) IMPLANT
SPOT EX ENDOSCOPIC TATTOO (MISCELLANEOUS)
SYR INFLATION 60ML (SYRINGE) ×1 IMPLANT
TRAP ETRAP POLY (MISCELLANEOUS) IMPLANT
VARIJECT INJECTOR VIN23 (MISCELLANEOUS)
WATER STERILE IRR 250ML POUR (IV SOLUTION) ×8 IMPLANT
WIRE CRE 18-20MM 8CM F G (MISCELLANEOUS) IMPLANT

## 2022-02-07 NOTE — Anesthesia Preprocedure Evaluation (Signed)
Anesthesia Evaluation  Patient identified by MRN, date of birth, ID band Patient awake    Reviewed: Allergy & Precautions, NPO status , Patient's Chart, lab work & pertinent test results, reviewed documented beta blocker date and time   History of Anesthesia Complications Negative for: history of anesthetic complications  Airway Mallampati: II  TM Distance: <3 FB Neck ROM: Full    Dental   Pulmonary asthma ,    breath sounds clear to auscultation       Cardiovascular (-) angina(-) DOE  Rhythm:Regular Rate:Normal   HLD   Neuro/Psych PSYCHIATRIC DISORDERS Anxiety Depression    GI/Hepatic GERD  ,  Endo/Other    Renal/GU      Musculoskeletal  (+) Arthritis ,   Abdominal   Peds  Hematology   Anesthesia Other Findings   Reproductive/Obstetrics                            Anesthesia Physical Anesthesia Plan  ASA: 2  Anesthesia Plan: General   Post-op Pain Management:    Induction: Intravenous  PONV Risk Score and Plan: 3 and Propofol infusion, TIVA and Treatment may vary due to age or medical condition  Airway Management Planned: Natural Airway and Nasal Cannula  Additional Equipment:   Intra-op Plan:   Post-operative Plan:   Informed Consent: I have reviewed the patients History and Physical, chart, labs and discussed the procedure including the risks, benefits and alternatives for the proposed anesthesia with the patient or authorized representative who has indicated his/her understanding and acceptance.       Plan Discussed with: CRNA and Anesthesiologist  Anesthesia Plan Comments:         Anesthesia Quick Evaluation

## 2022-02-07 NOTE — Op Note (Signed)
[facility] Patient Name: Evelyn Choi Procedure Date: 02/07/2022 10:31 AM MRN: [id number] Account #: [id number] Date of Birth: 04/02/1966 Admit Type: Outpatient Age: 56 Room: [facility] OR ROOM 01 Gender: Female Note Status: Finalized Instrument Name: [id number] Procedure:             Colonoscopy Indications:           Chronic diarrhea Providers:             Lucilla Lame MD, MD Medicines:             Propofol per Anesthesia Complications:         No immediate complications. Procedure:             Pre-Anesthesia Assessment:                        - Prior to the procedure, a History and Physical was                         performed, and patient medications and allergies were                         reviewed. The patient's tolerance of previous                         anesthesia was also reviewed. The risks and benefits                         of the procedure and the sedation options and risks                         were discussed with the patient. All questions were                         answered, and informed consent was obtained. Prior                         Anticoagulants: The patient has taken no previous                         anticoagulant or antiplatelet agents. ASA Grade                         Assessment: II - A patient with mild systemic disease.                         After reviewing the risks and benefits, the patient                         was deemed in satisfactory condition to undergo the                         procedure.                        After obtaining informed consent, the colonoscope was                         passed under direct vision. Throughout the procedure,  the patient's blood pressure, pulse, and oxygen                         saturations were monitored continuously. The                         Colonoscope was introduced through the anus and                         advanced to the the  terminal ileum. The colonoscopy                         was performed without difficulty. The patient                         tolerated the procedure well. The quality of the bowel                         preparation was excellent. Findings:      The perianal and digital rectal examinations were normal.      The terminal ileum appeared normal. Biopsies were taken with a cold       forceps for histology.      The colon (entire examined portion) appeared normal. Random biopsies       were obtained in the entire colon with cold forceps for histology. Impression:            - The examined portion of the ileum was normal.                         Biopsied.                        - The entire examined colon is normal.                        - Random biopsies were obtained in the entire colon. Recommendation:        - Discharge patient to home.                        - Resume previous diet.                        - Continue present medications.                        - Await pathology results. Procedure Code(s):     --- Professional ---                        [phone], Colonoscopy, flexible; with biopsy, single or                         multiple Diagnosis Code(s):     --- Professional ---                        K52.9, Noninfective gastroenteritis and colitis,                         unspecified CPT copyright 2019 American Medical Association. All rights reserved. The codes documented  in this report are preliminary and upon coder review may  be revised to meet current compliance requirements. Lucilla Lame MD, MD 02/07/2022 11:06:55 AM This report has been signed electronically. Number of Addenda: 0 Note Initiated On: 02/07/2022 10:31 AM Scope Withdrawal Time: 0 hours 6 minutes 24 seconds  Total Procedure Duration: 0 hours 11 minutes 9 seconds  Estimated Blood Loss:  Estimated blood loss: none.      [facility]

## 2022-02-07 NOTE — Op Note (Signed)
[facility] Patient Name: Evelyn Choi Procedure Date: 02/07/2022 10:35 AM MRN: [id number] Account #: [id number] Date of Birth: 05/14/1966 Admit Type: Outpatient Age: 56 Room: [facility] OR ROOM 01 Gender: Female Note Status: Finalized Instrument Name: [id number] Procedure:             Upper GI endoscopy Indications:           Dysphagia Providers:             Lucilla Lame MD, MD Referring MD:          Jearld Fenton (Referring MD) Medicines:             Propofol per Anesthesia Complications:         No immediate complications. Procedure:             Pre-Anesthesia Assessment:                        - Prior to the procedure, a History and Physical was                         performed, and patient medications and allergies were                         reviewed. The patient's tolerance of previous                         anesthesia was also reviewed. The risks and benefits                         of the procedure and the sedation options and risks                         were discussed with the patient. All questions were                         answered, and informed consent was obtained. Prior                         Anticoagulants: The patient has taken no previous                         anticoagulant or antiplatelet agents. ASA Grade                         Assessment: II - A patient with mild systemic disease.                         After reviewing the risks and benefits, the patient                         was deemed in satisfactory condition to undergo the                         procedure.                        After obtaining informed consent, the endoscope was  passed under direct vision. Throughout the procedure,                         the patient's blood pressure, pulse, and oxygen                         saturations were monitored continuously. The Endoscope                         was introduced through the mouth,  and advanced to the                         second part of duodenum. The upper GI endoscopy was                         accomplished without difficulty. The patient tolerated                         the procedure well. Findings:      The examined esophagus was normal. Two biopsies were obtained in the       middle third of the esophagus with cold forceps for histology. A TTS       dilator was passed through the scope. Dilation with a 15-16.5-18 mm       balloon dilator was performed to 18 mm. The dilation site was examined       following endoscope reinsertion and showed complete resolution of       luminal narrowing.      The stomach was normal.      The examined duodenum was normal. Biopsies were taken with a cold       forceps for histology. Impression:            - Normal esophagus. Dilated.                        - Normal stomach.                        - Normal examined duodenum. Biopsied.                        - Two biopsies were obtained in the middle third of                         the esophagus. Recommendation:        - Discharge patient to home.                        - Resume previous diet.                        - Continue present medications.                        - Await pathology results.                        - Perform a colonoscopy today. Procedure Code(s):     --- Professional ---  [phone], Esophagogastroduodenoscopy, flexible,                         transoral; with transendoscopic balloon dilation of                         esophagus (less than 30 mm diameter)                        43239, 59, Esophagogastroduodenoscopy, flexible,                         transoral; with biopsy, single or multiple Diagnosis Code(s):     --- Professional ---                        R13.10, Dysphagia, unspecified CPT copyright 2019 American Medical Association. All rights reserved. The codes documented in this report are preliminary and upon coder review may   be revised to meet current compliance requirements. Lucilla Lame MD, MD 02/07/2022 10:51:47 AM This report has been signed electronically. Number of Addenda: 0 Note Initiated On: 02/07/2022 10:35 AM Total Procedure Duration: 0 hours 5 minutes 39 seconds  Estimated Blood Loss:  Estimated blood loss: none.      [facility]

## 2022-02-07 NOTE — Anesthesia Postprocedure Evaluation (Signed)
Anesthesia Post Note  Patient: Evelyn Choi  Procedure(s) Performed: COLONOSCOPY (Rectum) Balloon dilation wire-guided ESOPHAGOGASTRODUODENOSCOPY (EGD) (Mouth)     Patient location during evaluation: PACU Anesthesia Type: General Level of consciousness: awake and alert Pain management: pain level controlled Vital Signs Assessment: post-procedure vital signs reviewed and stable Respiratory status: spontaneous breathing, nonlabored ventilation, respiratory function stable and patient connected to nasal cannula oxygen Cardiovascular status: blood pressure returned to baseline and stable Postop Assessment: no apparent nausea or vomiting Anesthetic complications: no   No notable events documented.  Chenoah Mcnally A  Anne Boltz

## 2022-02-07 NOTE — Transfer of Care (Signed)
Immediate Anesthesia Transfer of Care Note  Patient: Evelyn Choi  Procedure(s) Performed: COLONOSCOPY (Rectum) Balloon dilation wire-guided ESOPHAGOGASTRODUODENOSCOPY (EGD) (Mouth)  Patient Location: PACU  Anesthesia Type: General  Level of Consciousness: awake, alert  and patient cooperative  Airway and Oxygen Therapy: Patient Spontanous Breathing and Patient connected to supplemental oxygen  Post-op Assessment: Post-op Vital signs reviewed, Patient's Cardiovascular Status Stable, Respiratory Function Stable, Patent Airway and No signs of Nausea or vomiting  Post-op Vital Signs: Reviewed and stable  Complications: No notable events documented.

## 2022-02-07 NOTE — H&P (Signed)
Lucilla Lame, MD [address] [place], [place] [address] [phone] Fax : [phone]  Primary Care Physician:  Jearld Fenton, NP Primary Gastroenterologist:  Dr. Allen Norris  Pre-Procedure History & Physical: HPI:  MIRIAH MARUYAMA is a 56 y.o. female is here for an endoscopy and colonoscopy.   Past Medical History:  Diagnosis Date   Asthma    BPPV (benign paroxysmal positional vertigo), unspecified laterality    2-3 episodes/yr   GERD (gastroesophageal reflux disease)    no current issues   Herniated disc, cervical    no limitations    Past Surgical History:  Procedure Laterality Date   BREAST BIOPSY Right 05/09/2020   stereo bx of calcs, x marker, path pending   COLONOSCOPY     COLONOSCOPY WITH PROPOFOL N/A 07/09/2016   Procedure: COLONOSCOPY WITH PROPOFOL;  Surgeon: Jonathon Bellows, MD;  Location: [facility];  Service: Endoscopy;  Laterality: N/A;   LASIK Bilateral 2020   TONSILLECTOMY     UTERINE FIBROID SURGERY      Prior to Admission medications   Medication Sig Start Date End Date Taking? Authorizing Provider  ALOE PO Take by mouth.   Yes [provider]  Ascorbic Acid (VITAMIN C PO) Take by mouth daily.   Yes [provider]  atorvastatin (LIPITOR) 10 MG tablet Take 1 tablet (10 mg total) by mouth daily. 11/21/21  Yes Baity, Coralie Keens, NP  citalopram (CELEXA) 10 MG tablet Take by mouth.   Yes [provider]  Dexlansoprazole 30 MG capsule DR Take 1 capsule (30 mg total) by mouth daily. 11/19/21  Yes Baity, Coralie Keens, NP  memantine (NAMENDA) 5 MG tablet Take by mouth at bedtime. 03/21/20  Yes [provider]  Omega-3 Fatty Acids (FISH OIL PO) Take by mouth daily.   Yes [provider]  propranolol (INDERAL) 20 MG tablet Take 20 mg by mouth 3 (three) times daily.   Yes [provider]  VITAMIN D PO Take by mouth daily.   Yes [provider]  albuterol (PROVENTIL HFA;VENTOLIN HFA)  108 (90 Base) MCG/ACT inhaler Inhale into the lungs every 6 (six) hours as needed for wheezing or shortness of breath.    [provider]  clonazePAM (KLONOPIN) 0.25 MG disintegrating tablet Take by mouth. Patient not taking: Reported on 02/03/2022 03/22/20   [provider]  doxycycline (VIBRAMYCIN) 100 MG capsule Take 100 mg by mouth daily as needed. Patient not taking: Reported on 02/03/2022 01/23/21   [provider]    Allergies as of 01/13/2022 - Review Complete 01/13/2022  Allergen Reaction Noted   Macrobid [nitrofurantoin] Shortness Of Breath and Rash 06/25/2016   Lexapro [escitalopram oxalate] Anxiety 06/23/2012    Family History  Problem Relation Age of Onset   Hypertension Mother    Depression Father    Hypothyroidism Father    Healthy Sister    Breast cancer Neg Hx    Colon cancer Neg Hx     Social History   Socioeconomic History   Marital status: Divorced    Spouse name: Not on file   Number of children: Not on file   Years of education: Not on file   Highest education level: Not on file  Occupational History   Not on file  Tobacco Use   Smoking status: Never   Smokeless tobacco: Never  Vaping Use   Vaping Use: Never used  Substance and Sexual Activity   Alcohol use: Yes    Alcohol/week:  1.0 standard drink of alcohol    Types: 1 Cans of beer per week   Drug use: Never   Sexual activity: Yes    Birth control/protection: Post-menopausal  Other Topics Concern   Not on file  Social History Narrative   Not on file   Social Determinants of Health   Financial Resource Strain: Not on file  Food Insecurity: Not on file  Transportation Needs: Not on file  Physical Activity: Not on file  Stress: Not on file  Social Connections: Not on file  Intimate Partner Violence: Not on file    Review of Systems: See HPI, otherwise negative ROS  Physical Exam: Ht '5\' 1"'$  (1.549 m)   Wt 62.6 kg   BMI 26.07 kg/m  General:   Alert,  pleasant  and cooperative in NAD Head:  Normocephalic and atraumatic. Neck:  Supple; no masses or thyromegaly. Lungs:  Clear throughout to auscultation.    Heart:  Regular rate and rhythm. Abdomen:  Soft, nontender and nondistended. Normal bowel sounds, without guarding, and without rebound.   Neurologic:  Alert and  oriented x4;  grossly normal neurologically.  Impression/Plan: RAFAELA DINIUS is here for an endoscopy and colonoscopy to be performed for dysphagia and diarrhea  Risks, benefits, limitations, and alternatives regarding  endoscopy and colonoscopy have been reviewed with the patient.  Questions have been answered.  All parties agreeable.   Lucilla Lame, MD  02/07/2022, 10:01 AM

## 2022-02-10 ENCOUNTER — Encounter: Payer: Self-pay | Admitting: Gastroenterology

## 2022-02-10 LAB — SURGICAL PATHOLOGY

## 2022-02-25 ENCOUNTER — Other Ambulatory Visit: Payer: Self-pay

## 2022-02-25 ENCOUNTER — Other Ambulatory Visit: Payer: BLUE CROSS/BLUE SHIELD

## 2022-02-25 DIAGNOSIS — E7800 Pure hypercholesterolemia, unspecified: Secondary | ICD-10-CM | POA: Diagnosis not present

## 2022-02-26 LAB — LIPID PANEL
Cholesterol: 199 mg/dL (ref <200)
HDL: 80 mg/dL (ref >=50)
LDL Cholesterol (Calc): 101 mg/dL (calc) — ABNORMAL HIGH
Non-HDL Cholesterol (Calc): 119 mg/dL (calc) (ref <130)
Total CHOL/HDL Ratio: 2.5 (calc) (ref <5.0)
Triglycerides: 87 mg/dL (ref <150)

## 2022-02-26 LAB — COMPREHENSIVE METABOLIC PANEL
AG Ratio: 2.2 (calc) (ref 1.0–2.5)
ALT: 23 U/L (ref 6–29)
AST: 18 U/L (ref 10–35)
Albumin: 4.2 g/dL (ref 3.6–5.1)
Alkaline phosphatase (APISO): 64 U/L (ref 37–153)
BUN: 17 mg/dL (ref 7–25)
CO2: 22 mmol/L (ref 20–32)
Calcium: 8.9 mg/dL (ref 8.6–10.4)
Chloride: 110 mmol/L (ref 98–110)
Creat: 0.74 mg/dL (ref 0.50–1.03)
Globulin: 1.9 g/dL (calc) (ref 1.9–3.7)
Glucose, Bld: 88 mg/dL (ref 65–99)
Potassium: 4.3 mmol/L (ref 3.5–5.3)
Sodium: 141 mmol/L (ref 135–146)
Total Bilirubin: 0.6 mg/dL (ref 0.2–1.2)
Total Protein: 6.1 g/dL (ref 6.1–8.1)

## 2022-03-05 ENCOUNTER — Encounter: Payer: Self-pay | Admitting: Medical Oncology

## 2022-03-05 ENCOUNTER — Emergency Department: Payer: BC Managed Care – PPO

## 2022-03-05 ENCOUNTER — Ambulatory Visit: Payer: Self-pay

## 2022-03-05 ENCOUNTER — Emergency Department
Admission: EM | Admit: 2022-03-05 | Discharge: 2022-03-05 | Disposition: A | Discharge status: Home / Self Care | Payer: BC Managed Care – PPO | Attending: Emergency Medicine | Admitting: Emergency Medicine

## 2022-03-05 DIAGNOSIS — R42 Dizziness and giddiness: Secondary | ICD-10-CM | POA: Diagnosis not present

## 2022-03-05 DIAGNOSIS — R079 Chest pain, unspecified: Secondary | ICD-10-CM | POA: Diagnosis not present

## 2022-03-05 DIAGNOSIS — R001 Bradycardia, unspecified: Secondary | ICD-10-CM | POA: Insufficient documentation

## 2022-03-05 LAB — T4, FREE: Free T4: 0.9 ng/dL (ref 0.61–1.12)

## 2022-03-05 LAB — TSH: TSH: 3.292 u[IU]/mL (ref 0.350–4.500)

## 2022-03-05 LAB — BASIC METABOLIC PANEL
Anion gap: 9 (ref 5–15)
BUN: 15 mg/dL (ref 6–20)
CO2: 24 mmol/L (ref 22–32)
Calcium: 9.4 mg/dL (ref 8.9–10.3)
Chloride: 108 mmol/L (ref 98–111)
Creatinine, Ser: 0.72 mg/dL (ref 0.44–1.00)
GFR, Estimated: >60 mL/min (ref >60)
Glucose, Bld: 99 mg/dL (ref 70–99)
Potassium: 4 mmol/L (ref 3.5–5.1)
Sodium: 141 mmol/L (ref 135–145)

## 2022-03-05 LAB — CBC
HCT: 39.3 % (ref 36.0–46.0)
Hemoglobin: 13.4 g/dL (ref 12.0–15.0)
MCH: 31.2 pg (ref 26.0–34.0)
MCHC: 34.1 g/dL (ref 30.0–36.0)
MCV: 91.6 fL (ref 80.0–100.0)
Platelets: 287 10*3/uL (ref 150–400)
RBC: 4.29 MIL/uL (ref 3.87–5.11)
RDW: 12.4 % (ref 11.5–15.5)
WBC: 5 10*3/uL (ref 4.0–10.5)
nRBC: 0 % (ref 0.0–0.2)

## 2022-03-05 LAB — HEPATIC FUNCTION PANEL
ALT: 22 U/L (ref 0–44)
AST: 23 U/L (ref 15–41)
Albumin: 4.4 g/dL (ref 3.5–5.0)
Alkaline Phosphatase: 64 U/L (ref 38–126)
Bilirubin, Direct: <0.1 mg/dL (ref 0.0–0.2)
Total Bilirubin: 1 mg/dL (ref 0.3–1.2)
Total Protein: 6.9 g/dL (ref 6.5–8.1)

## 2022-03-05 LAB — TROPONIN I (HIGH SENSITIVITY)
Troponin I (High Sensitivity): 3 ng/L (ref <18)
Troponin I (High Sensitivity): 4 ng/L (ref <18)

## 2022-03-05 LAB — LIPASE, BLOOD: Lipase: 35 U/L (ref 11–51)

## 2022-03-05 LAB — CK: Total CK: 50 U/L (ref 38–234)

## 2022-03-05 MED ORDER — PROPRANOLOL HCL 20 MG PO TABS: 20.0000 mg | ORAL_TABLET | Freq: Three times a day (TID) | ORAL | 0 refills | Status: DC

## 2022-03-05 NOTE — Discharge Instructions (Signed)
Please seek medical attention for any high fevers, chest pain, shortness of breath, change in behavior, persistent vomiting, bloody stool or any other new or concerning symptoms.  

## 2022-03-05 NOTE — ED Provider Notes (Signed)
[facility] Provider Note    Event Date/Time   First MD Initiated Contact with Patient 03/05/22 1709     (approximate)   History   Chest Pain   HPI  Evelyn Choi is a 56 y.o. female  who presents to the emergency department today because of concern for lightheadedness continued chest pain and low heart rate. The patient states that for some time she has been dealing with chest discomfort which she thinks might be related to indigestion. She however has also been having episodes of lightheadedness. She did have an episode earlier today and checked her blood pressure and heart rate. She did find that her heart rate was very low. Does wonder if it could be her propanolol.   Physical Exam   Triage Vital Signs: ED Triage Vitals [03/05/22 1136]  Enc Vitals Group     BP 131/84     Pulse Rate (!) 53     Resp 17     Temp 98.9 F (37.2 C)     Temp Source Oral     SpO2 100 %     Weight 137 lb (62.1 kg)     Height '5\' 1"'$  (1.549 m)     Head Circumference      Peak Flow      Pain Score 7   Most recent vital signs: Vitals:   03/05/22 1136  BP: 131/84  Pulse: (!) 53  Resp: 17  Temp: 98.9 F (37.2 C)  SpO2: 100%   General: Awake, alert, oriented. CV:  Good peripheral perfusion. Bradycardia Resp:  Normal effort. Lungs clear. Abd:  No distention.    ED Results / Procedures / Treatments   Labs (all labs ordered are listed, but only abnormal results are displayed) Labs Reviewed  BASIC METABOLIC PANEL  CBC  HEPATIC FUNCTION PANEL  LIPASE, BLOOD  CK  TSH  T4, FREE  TROPONIN I (HIGH SENSITIVITY)  TROPONIN I (HIGH SENSITIVITY)     EKG  I, Nance Pear, attending physician, personally viewed and interpreted this EKG  EKG Time: 1138 Rate: 53 Rhythm: sinus bradycardia Axis: normal Intervals: qtc 426 QRS: IVCD ST changes: no st elevation Impression: abnormal ekg   RADIOLOGY I independently interpreted and visualized the CXR. My  interpretation: No pneumonia. No pneumothorax.  Radiology interpretation:  IMPRESSION:  No active cardiopulmonary disease.       PROCEDURES:  Critical Care performed: No  Procedures   MEDICATIONS ORDERED IN ED: Medications - No data to display   IMPRESSION / MDM / [place] / ED COURSE  I reviewed the triage vital signs and the nursing notes.                              Differential diagnosis includes, but is not limited to, medication side effect, hypothyroidism, anemia.  Patient's presentation is most consistent with acute presentation with potential threat to life or bodily function.  Patient presents to the emergency department today because of concern for lightheadedness, chest pain, bradycardia. Blood work without concerning anemia, electrolyte abnormality or elevated troponin. Patient was bradycardic, however was sinus bradycardia. Did send of thyroid studies as well which came back normal. At this time do wonder if it could be related to medication, will give prescription for smaller dose of propranolol.   FINAL CLINICAL IMPRESSION(S) / ED DIAGNOSES   Final diagnoses:  Lightheadedness  Bradycardia    Note:  This document  was prepared using Systems analyst and may include unintentional dictation errors.    Nance Pear, MD 03/05/22 [phone]

## 2022-03-05 NOTE — ED Provider Triage Note (Signed)
Emergency Medicine Provider Triage Evaluation Note  LOA IDLER , a 56 y.o. female  was evaluated in triage.  Pt complains of chest pain and fatigue that began today with SOB. No abdominal pain, n/v/d. Recently started a new statin. No myalgias.  Review of Systems  Positive: CP/fatigue, nausea Negative: SOB, leg swelling  Physical Exam  BP 131/84 (BP Location: Left Arm)   Pulse (!) 53   Temp 98.9 F (37.2 C) (Oral)   Resp 17   Ht '5\' 1"'$  (1.549 m)   Wt 62.1 kg   SpO2 100%   BMI 25.89 kg/m  Gen:   Awake, no distress   Resp:  Normal effort  MSK:   Moves extremities without difficulty  Other:    Medical Decision Making  Medically screening exam initiated at 11:37 AM.  Appropriate orders placed.  TAKIMA ENCINA was informed that the remainder of the evaluation will be completed by another provider, this initial triage assessment does not replace that evaluation, and the importance of remaining in the ED until their evaluation is complete.    Marquette Old, PA-C 03/05/22 1140

## 2022-03-05 NOTE — ED Notes (Signed)
Dr. Archie Balboa notified that pt's HR is sustaining in the 40's. Will continue to monitor.

## 2022-03-05 NOTE — ED Triage Notes (Signed)
Pt reports that for months she has been having central chest pain that feels like indigestion, today pain is tightness. Pt reports that this am she began to feel very fatigued and sob with the pain.

## 2022-03-05 NOTE — Telephone Encounter (Signed)
  Chief Complaint: bradycardia Symptoms: HR 49, dizziness, difficulty breathing at times, feeling flutters Frequency: ongoing for several weeks Pertinent Negatives: Patient denies dizziness and SOB present Disposition: '[x]'$ ED /'[]'$ Urgent Care (no appt availability in office) / '[]'$ Appointment(In office/virtual)/ '[]'$  [place] [facility]/ '[]'$ Home Care/ '[]'$ Refused Recommended Disposition /'[]'$ [place] [facility]/ '[]'$  Follow-up with PCP Additional Notes: pt hasn't been seen for this since 2020 with [facility]. Had pt recheck HR, she said it was 83. Said she didn't feel as bad but it comes and goes. I advised pt d/t HR varies to go to ED for eval and make sure she not having afib. Pt verbalized understanding.   Reason for Disposition  Heart beating very slowly (e.g., < 50 / minute)  (Exception: athlete and heart rate normal for caller)  Answer Assessment - Initial Assessment Questions 1. DESCRIPTION: "Please describe your heart rate or heartbeat that you are having" (e.g., fast/slow, regular/irregular, skipped or extra beats, "palpitations")     Low HR, flutters   2. ONSET: "When did it start?" (Minutes, hours or days)      Been going for few weeks   4. PATTERN "Does it come and go, or has it been constant since it started?"  "Does it get worse with exertion?"   "Are you feeling it now?"     Comes and goes   6. HEART RATE: "Can you tell me your heart rate?" "How many beats in 15 seconds?"  (Note: not all patients can do this)       49  10. OTHER SYMPTOMS: "Do you have any other symptoms?" (e.g., dizziness, chest pain, sweating, difficulty breathing)       Dizziness and breathing difficulty at times  Protocols used: Heart Rate and Heartbeat Questions-A-AH

## 2022-03-06 NOTE — Telephone Encounter (Signed)
ED note reviewed

## 2022-03-19 ENCOUNTER — Encounter: Payer: Self-pay | Admitting: Family Medicine

## 2022-03-19 ENCOUNTER — Ambulatory Visit (INDEPENDENT_AMBULATORY_CARE_PROVIDER_SITE_OTHER): Payer: BC Managed Care – PPO | Admitting: Family Medicine

## 2022-03-19 VITALS — BP 124/67 | HR 68 | Ht 61.0 in | Wt 136.6 lb

## 2022-03-19 DIAGNOSIS — J4521 Mild intermittent asthma with (acute) exacerbation: Secondary | ICD-10-CM | POA: Diagnosis not present

## 2022-03-19 DIAGNOSIS — J0110 Acute frontal sinusitis, unspecified: Secondary | ICD-10-CM | POA: Diagnosis not present

## 2022-03-19 DIAGNOSIS — H1033 Unspecified acute conjunctivitis, bilateral: Secondary | ICD-10-CM

## 2022-03-19 MED ORDER — IPRATROPIUM BROMIDE 0.06 % NA SOLN: 2.0000 | Freq: Four times a day (QID) | NASAL | 0 refills | Status: DC

## 2022-03-19 MED ORDER — AMOXICILLIN-POT CLAVULANATE 875-125 MG PO TABS: 1.0000 | ORAL_TABLET | Freq: Two times a day (BID) | ORAL | 0 refills | Status: DC

## 2022-03-19 MED ORDER — POLYMYXIN B-TRIMETHOPRIM 10000-0.1 UNIT/ML-% OP SOLN: 1.0000 [drp] | Freq: Four times a day (QID) | OPHTHALMIC | 0 refills | Status: DC

## 2022-03-19 MED ORDER — PREDNISONE 50 MG PO TABS: 50.0000 mg | ORAL_TABLET | Freq: Every day | ORAL | 0 refills | Status: DC

## 2022-03-19 NOTE — Progress Notes (Signed)
Subjective:    Patient ID: Evelyn Choi, female    DOB: 06/30/1966, 56 y.o.   MRN: [id number]  Evelyn Choi is a 56 y.o. female presenting on 03/19/2022 for Headache and Sore Throat   HPI  Sinusitis Headache Conjunctivitis 2 weeks ago, sinus drainage headache, pressure, worse coughing now impacting eyes with conjunctivitis redness, drainage. Grandchildren also sick as well Asthma and it has flared up now worse with wheezing. Has inhaler PRN No COVID testing Denies fever chills      11/19/2021    3:49 PM  Depression screen PHQ 2/9  Decreased Interest 0  Down, Depressed, Hopeless 0  PHQ - 2 Score 0  Altered sleeping 1  Tired, decreased energy 1  Change in appetite 0  Feeling bad or failure about yourself  0  Trouble concentrating 0  Moving slowly or fidgety/restless 0  Suicidal thoughts 0  PHQ-9 Score 2  Difficult doing work/chores Not difficult at all    Social History   Tobacco Use   Smoking status: Never   Smokeless tobacco: Never  Vaping Use   Vaping Use: Never used  Substance Use Topics   Alcohol use: Yes    Alcohol/week: 1.0 standard drink of alcohol    Types: 1 Cans of beer per week   Drug use: Never    Review of Systems Per HPI unless specifically indicated above     Objective:    BP 124/67   Pulse 68   Ht '5\' 1"'$  (1.549 m)   Wt 136 lb 9.6 oz (62 kg)   SpO2 100%   BMI 25.81 kg/m   Wt Readings from Last 3 Encounters:  03/19/22 136 lb 9.6 oz (62 kg)  03/05/22 137 lb (62.1 kg)  02/07/22 130 lb (59 kg)    Physical Exam Vitals and nursing note reviewed.  Constitutional:      General: She is not in acute distress.    Appearance: She is well-developed. She is not diaphoretic.     Comments: Well-appearing, comfortable, cooperative  HENT:     Head: Normocephalic and atraumatic.  Eyes:     General:        Right eye: No discharge.        Left eye: No discharge.     Extraocular Movements: Extraocular movements intact.      Conjunctiva/sclera: Conjunctivae normal.     Pupils: Pupils are equal, round, and reactive to light.     Comments: Significant conjunctivitis discharge bilateral  Neck:     Thyroid: No thyromegaly.  Cardiovascular:     Rate and Rhythm: Normal rate and regular rhythm.     Heart sounds: Normal heart sounds. No murmur heard. Pulmonary:     Effort: Pulmonary effort is normal. No respiratory distress.     Breath sounds: Wheezing present. No rales.  Musculoskeletal:        General: Normal range of motion.     Cervical back: Normal range of motion and neck supple.  Lymphadenopathy:     Cervical: No cervical adenopathy.  Skin:    General: Skin is warm and dry.     Findings: No erythema or rash.  Neurological:     Mental Status: She is alert and oriented to person, place, and time.  Psychiatric:        Behavior: Behavior normal.     Comments: Well groomed, good eye contact, normal speech and thoughts    Results for orders placed or performed during the hospital encounter of  14/97/02  Basic metabolic panel  Result Value Ref Range   Sodium 141 135 - 145 mmol/L   Potassium 4.0 3.5 - 5.1 mmol/L   Chloride 108 98 - 111 mmol/L   CO2 24 22 - 32 mmol/L   Glucose, Bld 99 70 - 99 mg/dL   BUN 15 6 - 20 mg/dL   Creatinine, Ser 0.72 0.44 - 1.00 mg/dL   Calcium 9.4 8.9 - 10.3 mg/dL   GFR, Estimated >60 >60 mL/min   Anion gap 9 5 - 15  CBC  Result Value Ref Range   WBC 5.0 4.0 - 10.5 K/uL   RBC 4.29 3.87 - 5.11 MIL/uL   Hemoglobin 13.4 12.0 - 15.0 g/dL   HCT 39.3 36.0 - 46.0 %   MCV 91.6 80.0 - 100.0 fL   MCH 31.2 26.0 - 34.0 pg   MCHC 34.1 30.0 - 36.0 g/dL   RDW 12.4 11.5 - 15.5 %   Platelets 287 150 - 400 K/uL   nRBC 0.0 0.0 - 0.2 %  Hepatic function panel  Result Value Ref Range   Total Protein 6.9 6.5 - 8.1 g/dL   Albumin 4.4 3.5 - 5.0 g/dL   AST 23 15 - 41 U/L   ALT 22 0 - 44 U/L   Alkaline Phosphatase 64 38 - 126 U/L   Total Bilirubin 1.0 0.3 - 1.2 mg/dL   Bilirubin, Direct  <0.1 0.0 - 0.2 mg/dL   Indirect Bilirubin NOT CALCULATED 0.3 - 0.9 mg/dL  Lipase, blood  Result Value Ref Range   Lipase 35 11 - 51 U/L  CK  Result Value Ref Range   Total CK 50 38 - 234 U/L  TSH  Result Value Ref Range   TSH 3.292 0.350 - 4.500 uIU/mL  T4, free  Result Value Ref Range   Free T4 0.90 0.61 - 1.12 ng/dL  Troponin I (High Sensitivity)  Result Value Ref Range   Troponin I (High Sensitivity) 3 <18 ng/L  Troponin I (High Sensitivity)  Result Value Ref Range   Troponin I (High Sensitivity) 4 <18 ng/L      Assessment & Plan:   Problem List Items Addressed This Visit     Asthma without status asthmaticus   Relevant Medications   predniSONE (DELTASONE) 50 MG tablet   Other Visit Diagnoses     Acute non-recurrent frontal sinusitis    -  Primary   Relevant Medications   predniSONE (DELTASONE) 50 MG tablet   amoxicillin-clavulanate (AUGMENTIN) 875-125 MG tablet   ipratropium (ATROVENT) 0.06 % nasal spray   Acute conjunctivitis of both eyes, unspecified acute conjunctivitis type       Relevant Medications   trimethoprim-polymyxin b (POLYTRIM) ophthalmic solution        Clinically most consistent with sinusitis given 2 week onset time line with sick contacts Now with conjunctivitis Concern with sinus symptoms progress to lower respiratory symptoms  Start therapy today with Augmentin for sinuses / lungs / eyes Add the antibiotic eye drop if not improving redness Warm compresses can help OTC tylenol ibuprofen sudafed as need  Start Atrovent nasal spray decongestant 2 sprays in each nostril up to 4 times daily for 7 days  Prednisone for asthma flare 5 days  May try at home COVID test if not improve  Meds ordered this encounter  Medications   predniSONE (DELTASONE) 50 MG tablet    Sig: Take 1 tablet (50 mg total) by mouth daily with breakfast.    Dispense:  5 tablet    Refill:  0   amoxicillin-clavulanate (AUGMENTIN) 875-125 MG tablet    Sig: Take 1  tablet by mouth 2 (two) times daily.    Dispense:  20 tablet    Refill:  0   trimethoprim-polymyxin b (POLYTRIM) ophthalmic solution    Sig: Place 1 drop into both eyes every 6 (six) hours.    Dispense:  10 mL    Refill:  0   ipratropium (ATROVENT) 0.06 % nasal spray    Sig: Place 2 sprays into both nostrils 4 (four) times daily. For up to 5-7 days then stop.    Dispense:  15 mL    Refill:  0      Follow up plan: Return if symptoms worsen or fail to improve.    Nobie Putnam, [facility] 03/19/2022, 3:16 PM

## 2022-03-19 NOTE — Patient Instructions (Addendum)
Thank you for coming to the office today.  Start therapy today with Augmentin for sinuses / lungs / eyes Add the antibiotic eye drop if not improving redness Warm compresses can help OTC tylenol ibuprofen sudafed as need  Start Atrovent nasal spray decongestant 2 sprays in each nostril up to 4 times daily for 7 days  Prednisone for asthma flare 5 days  May try at home COVID test if not improve  Please schedule a Follow-up Appointment to: Return if symptoms worsen or fail to improve.  If you have any other questions or concerns, please feel free to call the office or send a message through [place]. You may also schedule an earlier appointment if necessary.  Additionally, you may be receiving a survey about your experience at our office within a few days to 1 week by e-mail or mail. We value your feedback.  Nobie Putnam, DO [place]

## 2022-05-14 DIAGNOSIS — R251 Tremor, unspecified: Secondary | ICD-10-CM | POA: Diagnosis not present

## 2022-05-14 DIAGNOSIS — F419 Anxiety disorder, unspecified: Secondary | ICD-10-CM | POA: Diagnosis not present

## 2022-05-14 DIAGNOSIS — R413 Other amnesia: Secondary | ICD-10-CM | POA: Diagnosis not present

## 2022-05-14 DIAGNOSIS — R42 Dizziness and giddiness: Secondary | ICD-10-CM | POA: Diagnosis not present

## 2022-05-23 ENCOUNTER — Other Ambulatory Visit: Payer: Self-pay | Admitting: Internal Medicine

## 2022-05-23 ENCOUNTER — Encounter: Payer: Self-pay | Admitting: Internal Medicine

## 2022-05-23 ENCOUNTER — Ambulatory Visit (INDEPENDENT_AMBULATORY_CARE_PROVIDER_SITE_OTHER): Payer: BC Managed Care – PPO | Admitting: Internal Medicine

## 2022-05-23 VITALS — BP 114/71 | HR 78 | Ht 61.0 in | Wt 134.8 lb

## 2022-05-23 DIAGNOSIS — Z23 Encounter for immunization: Secondary | ICD-10-CM

## 2022-05-23 DIAGNOSIS — Z114 Encounter for screening for human immunodeficiency virus [HIV]: Secondary | ICD-10-CM | POA: Diagnosis not present

## 2022-05-23 DIAGNOSIS — E663 Overweight: Secondary | ICD-10-CM

## 2022-05-23 DIAGNOSIS — Z1159 Encounter for screening for other viral diseases: Secondary | ICD-10-CM | POA: Diagnosis not present

## 2022-05-23 DIAGNOSIS — Z1231 Encounter for screening mammogram for malignant neoplasm of breast: Secondary | ICD-10-CM

## 2022-05-23 DIAGNOSIS — Z0001 Encounter for general adult medical examination with abnormal findings: Secondary | ICD-10-CM | POA: Diagnosis not present

## 2022-05-23 DIAGNOSIS — Z780 Asymptomatic menopausal state: Secondary | ICD-10-CM | POA: Diagnosis not present

## 2022-05-23 DIAGNOSIS — Z6825 Body mass index (BMI) 25.0-25.9, adult: Secondary | ICD-10-CM

## 2022-05-23 MED ORDER — DOXYCYCLINE HYCLATE 100 MG PO CAPS: 100.0000 mg | ORAL_CAPSULE | Freq: Two times a day (BID) | ORAL | 0 refills | Status: DC

## 2022-05-23 NOTE — Progress Notes (Signed)
Subjective:    Patient ID: Evelyn Choi, female    DOB: Aug 17, 1966, 56 y.o.   MRN: [id number]  HPI  Pt presents to the clinic today for her annual exam.  Flu: 08/2015 Tetanus: 12/2011 Pneumovax: 09/2015 Covid: Pfizer x 2 Shingrix:  10/2021, 11/2021 Pap smear: 09/2018 Mammogram: 06/2021 Bone density: never Colon screening: 01/2022 Vision screening: as needed Dentist: biannually  Diet: She does eat meat. She consumes fruits and veggies. She tries to avoid fried foods. She drinks mostly sweet tea. Exercise: Ride bicycle, walking  Review of Systems  Past Medical History:  Diagnosis Date   Asthma    BPPV (benign paroxysmal positional vertigo), unspecified laterality    2-3 episodes/yr   GERD (gastroesophageal reflux disease)    no current issues   Herniated disc, cervical    no limitations    Current Outpatient Medications  Medication Sig Dispense Refill   albuterol (PROVENTIL HFA;VENTOLIN HFA) 108 (90 Base) MCG/ACT inhaler Inhale into the lungs every 6 (six) hours as needed for wheezing or shortness of breath.     ALOE PO Take by mouth.     amoxicillin-clavulanate (AUGMENTIN) 875-125 MG tablet Take 1 tablet by mouth 2 (two) times daily. 20 tablet 0   Ascorbic Acid (VITAMIN C PO) Take by mouth daily.     atorvastatin (LIPITOR) 10 MG tablet TAKE 1 TABLET(10 MG) BY MOUTH DAILY 30 tablet 2   citalopram (CELEXA) 10 MG tablet Take by mouth.     clonazePAM (KLONOPIN) 0.25 MG disintegrating tablet Take by mouth. (Patient not taking: Reported on 02/03/2022)     Dexlansoprazole 30 MG capsule DR Take 1 capsule (30 mg total) by mouth daily. 90 capsule 1   doxycycline (VIBRAMYCIN) 100 MG capsule Take 100 mg by mouth daily as needed. (Patient not taking: Reported on 02/03/2022)     ipratropium (ATROVENT) 0.06 % nasal spray Place 2 sprays into both nostrils 4 (four) times daily. For up to 5-7 days then stop. 15 mL 0   memantine (NAMENDA) 5 MG tablet Take by mouth at bedtime.     Omega-3  Fatty Acids (FISH OIL PO) Take by mouth daily.     predniSONE (DELTASONE) 50 MG tablet Take 1 tablet (50 mg total) by mouth daily with breakfast. 5 tablet 0   propranolol (INDERAL) 20 MG tablet Take 20 mg by mouth 3 (three) times daily.     propranolol (INDERAL) 20 MG tablet Take 1 tablet (20 mg total) by mouth 3 (three) times daily. 90 tablet 0   trimethoprim-polymyxin b (POLYTRIM) ophthalmic solution Place 1 drop into both eyes every 6 (six) hours. 10 mL 0   VITAMIN D PO Take by mouth daily.     No current facility-administered medications for this visit.    Allergies  Allergen Reactions   Macrobid [Nitrofurantoin] Shortness Of Breath and Rash    Chest Pains   Lexapro [Escitalopram Oxalate] Anxiety    Family History  Problem Relation Age of Onset   Hypertension Mother    Depression Father    Hypothyroidism Father    Healthy Sister    Breast cancer Neg Hx    Colon cancer Neg Hx     Social History   Socioeconomic History   Marital status: Divorced    Spouse name: Not on file   Number of children: Not on file   Years of education: Not on file   Highest education level: Not on file  Occupational History   Not on file  Tobacco Use   Smoking status: Never   Smokeless tobacco: Never  Vaping Use   Vaping Use: Never used  Substance and Sexual Activity   Alcohol use: Yes    Alcohol/week: 1.0 standard drink of alcohol    Types: 1 Cans of beer per week   Drug use: Never   Sexual activity: Yes    Birth control/protection: Post-menopausal  Other Topics Concern   Not on file  Social History Narrative   Not on file   Social Determinants of Health   Financial Resource Strain: Not on file  Food Insecurity: Not on file  Transportation Needs: Not on file  Physical Activity: Not on file  Stress: Not on file  Social Connections: Not on file  Intimate Partner Violence: Not on file     Constitutional: Denies fever, malaise, fatigue, headache or abrupt weight changes.   HEENT: Denies eye pain, eye redness, ear pain, ringing in the ears, wax buildup, runny nose, nasal congestion, bloody nose, or sore throat. Respiratory: Denies difficulty breathing, shortness of breath, cough or sputum production.   Cardiovascular: Denies chest pain, chest tightness, palpitations or swelling in the hands or feet.  Gastrointestinal: Denies abdominal pain, bloating, constipation, diarrhea or blood in the stool.  GU: Denies urgency, frequency, pain with urination, burning sensation, blood in urine, odor or discharge. Musculoskeletal: Denies decrease in range of motion, difficulty with gait, muscle pain or joint pain and swelling.  Skin: Denies redness, rashes, lesions or ulcercations.  Neurological: Pt reports tremor, intermittent dizziness. Denies difficulty with memory, difficulty with speech or problems with balance and coordination.  Psych: Pt has a history of anxiety and depression. Denies SI/HI.  No other specific complaints in a complete review of systems (except as listed in HPI above).     Objective:   Physical Exam   BP 114/71   Pulse 78   Ht '5\' 1"'$  (1.549 m)   Wt 134 lb 12.8 oz (61.1 kg)   SpO2 100%   BMI 25.47 kg/m   Wt Readings from Last 3 Encounters:  03/19/22 136 lb 9.6 oz (62 kg)  03/05/22 137 lb (62.1 kg)  02/07/22 130 lb (59 kg)    General: Appears her stated age, overweight, in NAD. Skin: Warm, dry and intact.  HEENT: Head: normal shape and size; Eyes: sclera white, no icterus, conjunctiva pink, PERRLA and EOMs intact;  Neck:  Neck supple, trachea midline. No masses, lumps or thyromegaly present.  Cardiovascular: Normal rate and rhythm. S1,S2 noted.  No murmur, rubs or gallops noted. No JVD or BLE edema. No carotid bruits noted. Pulmonary/Chest: Normal effort and positive vesicular breath sounds. No respiratory distress. No wheezes, rales or ronchi noted.  Abdomen: Normal bowel sounds. Musculoskeletal: Strength 5/5 BUE/BLE. No difficulty with  gait.  Neurological: Alert and oriented. Cranial nerves II-XII grossly intact. Coordination normal.  Psychiatric: Mood and affect normal. Behavior is normal. Judgment and thought content normal.    BMET    Component Value Date/Time   NA 141 03/05/2022 1138   NA 139 04/30/2012 0838   K 4.0 03/05/2022 1138   K 4.1 04/30/2012 0838   CL 108 03/05/2022 1138   CL 107 04/30/2012 0838   CO2 24 03/05/2022 1138   CO2 23 04/30/2012 0838   GLUCOSE 99 03/05/2022 1138   GLUCOSE 133 (H) 04/30/2012 0838   BUN 15 03/05/2022 1138   BUN 14 04/30/2012 0838   CREATININE 0.72 03/05/2022 1138   CREATININE 0.74 02/25/2022 0907   CALCIUM 9.4  03/05/2022 1138   CALCIUM 9.1 04/30/2012 0838   GFRNONAA >60 03/05/2022 1138   GFRNONAA >60 04/30/2012 0838   GFRAA >60 04/30/2012 0838    Lipid Panel     Component Value Date/Time   CHOL 199 02/25/2022 0907   TRIG 87 02/25/2022 0907   HDL 80 02/25/2022 0907   CHOLHDL 2.5 02/25/2022 0907   LDLCALC 101 (H) 02/25/2022 0907    CBC    Component Value Date/Time   WBC 5.0 03/05/2022 1138   RBC 4.29 03/05/2022 1138   HGB 13.4 03/05/2022 1138   HGB 14.9 04/30/2012 0838   HCT 39.3 03/05/2022 1138   HCT 42.5 04/30/2012 0838   PLT 287 03/05/2022 1138   PLT 300 04/30/2012 0838   MCV 91.6 03/05/2022 1138   MCV 95 04/30/2012 0838   MCH 31.2 03/05/2022 1138   MCHC 34.1 03/05/2022 1138   RDW 12.4 03/05/2022 1138   RDW 12.9 04/30/2012 0838    Hgb A1C Lab Results  Component Value Date   HGBA1C 5.1 11/19/2021           Assessment & Plan:   Preventative Health Maintenance:  Flu shot today She declines Tetanus today Pneumovax today Encouraged her to get her covid booster Shingrix vaccine UTD Pap smear UTD Mammogram and bone density ordered- she will call to schedule Colon screening UTD Encouraged her to consume a balanced diet and exercise regimen Advised her to see an eye doctor and dentist annually Will check CBC, CMET, Lipid, HIV and Hep C  today  RTC in 6 months, follow up chronic conditions Webb Silversmith, NP

## 2022-05-23 NOTE — Patient Instructions (Signed)
Health Maintenance for Postmenopausal Women Menopause is a normal process in which your ability to get pregnant comes to an end. This process happens slowly over many months or years, usually between the ages of 48 and 55. Menopause is complete when you have missed your menstrual period for 12 months. It is important to talk with your health care provider about some of the most common conditions that affect women after menopause (postmenopausal women). These include heart disease, cancer, and bone loss (osteoporosis). Adopting a healthy lifestyle and getting preventive care can help to promote your health and wellness. The actions you take can also lower your chances of developing some of these common conditions. What are the signs and symptoms of menopause? During menopause, you may have the following symptoms: Hot flashes. These can be moderate or severe. Night sweats. Decrease in sex drive. Mood swings. Headaches. Tiredness (fatigue). Irritability. Memory problems. Problems falling asleep or staying asleep. Talk with your health care provider about treatment options for your symptoms. Do I need hormone replacement therapy? Hormone replacement therapy is effective in treating symptoms that are caused by menopause, such as hot flashes and night sweats. Hormone replacement carries certain risks, especially as you become older. If you are thinking about using estrogen or estrogen with progestin, discuss the benefits and risks with your health care provider. How can I reduce my risk for heart disease and stroke? The risk of heart disease, heart attack, and stroke increases as you age. One of the causes may be a change in the body's hormones during menopause. This can affect how your body uses dietary fats, triglycerides, and cholesterol. Heart attack and stroke are medical emergencies. There are many things that you can do to help prevent heart disease and stroke. Watch your blood pressure High  blood pressure causes heart disease and increases the risk of stroke. This is more likely to develop in people who have high blood pressure readings or are overweight. Have your blood pressure checked: Every 3-5 years if you are 18-39 years of age. Every year if you are 40 years old or older. Eat a healthy diet  Eat a diet that includes plenty of vegetables, fruits, low-fat dairy products, and lean protein. Do not eat a lot of foods that are high in solid fats, added sugars, or sodium. Get regular exercise Get regular exercise. This is one of the most important things you can do for your health. Most adults should: Try to exercise for at least 150 minutes each week. The exercise should increase your heart rate and make you sweat (moderate-intensity exercise). Try to do strengthening exercises at least twice each week. Do these in addition to the moderate-intensity exercise. Spend less time sitting. Even light physical activity can be beneficial. Other tips Work with your health care provider to achieve or maintain a healthy weight. Do not use any products that contain nicotine or tobacco. These products include cigarettes, chewing tobacco, and vaping devices, such as e-cigarettes. If you need help quitting, ask your health care provider. Know your numbers. Ask your health care provider to check your cholesterol and your blood sugar (glucose). Continue to have your blood tested as directed by your health care provider. Do I need screening for cancer? Depending on your health history and family history, you may need to have cancer screenings at different stages of your life. This may include screening for: Breast cancer. Cervical cancer. Lung cancer. Colorectal cancer. What is my risk for osteoporosis? After menopause, you may be   at increased risk for osteoporosis. Osteoporosis is a condition in which bone destruction happens more quickly than new bone creation. To help prevent osteoporosis or  the bone fractures that can happen because of osteoporosis, you may take the following actions: If you are 19-50 years old, get at least 1,000 mg of calcium and at least 600 international units (IU) of vitamin D per day. If you are older than age 50 but younger than age 70, get at least 1,200 mg of calcium and at least 600 international units (IU) of vitamin D per day. If you are older than age 70, get at least 1,200 mg of calcium and at least 800 international units (IU) of vitamin D per day. Smoking and drinking excessive alcohol increase the risk of osteoporosis. Eat foods that are rich in calcium and vitamin D, and do weight-bearing exercises several times each week as directed by your health care provider. How does menopause affect my mental health? Depression may occur at any age, but it is more common as you become older. Common symptoms of depression include: Feeling depressed. Changes in sleep patterns. Changes in appetite or eating patterns. Feeling an overall lack of motivation or enjoyment of activities that you previously enjoyed. Frequent crying spells. Talk with your health care provider if you think that you are experiencing any of these symptoms. General instructions See your health care provider for regular wellness exams and vaccines. This may include: Scheduling regular health, dental, and eye exams. Getting and maintaining your vaccines. These include: Influenza vaccine. Get this vaccine each year before the flu season begins. Pneumonia vaccine. Shingles vaccine. Tetanus, diphtheria, and pertussis (Tdap) booster vaccine. Your health care provider may also recommend other immunizations. Tell your health care provider if you have ever been abused or do not feel safe at home. Summary Menopause is a normal process in which your ability to get pregnant comes to an end. This condition causes hot flashes, night sweats, decreased interest in sex, mood swings, headaches, or lack  of sleep. Treatment for this condition may include hormone replacement therapy. Take actions to keep yourself healthy, including exercising regularly, eating a healthy diet, watching your weight, and checking your blood pressure and blood sugar levels. Get screened for cancer and depression. Make sure that you are up to date with all your vaccines. This information is not intended to replace advice given to you by your health care provider. Make sure you discuss any questions you have with your health care provider. Document Revised: 01/07/2021 Document Reviewed: 01/07/2021 Elsevier Patient Education  2023 Elsevier Inc.  

## 2022-05-23 NOTE — Telephone Encounter (Signed)
Requested Prescriptions  Pending Prescriptions Disp Refills  . atorvastatin (LIPITOR) 10 MG tablet [Pharmacy Med Name: ATORVASTATIN '10MG'$  TABLETS] 30 tablet 2    Sig: TAKE 1 TABLET(10 MG) BY MOUTH DAILY     Cardiovascular:  Antilipid - Statins Failed - 05/23/2022  3:33 AM      Failed - Lipid Panel in normal range within the last 12 months    Cholesterol  Date Value Ref Range Status  02/25/2022 199 <200 mg/dL Final   LDL Cholesterol (Calc)  Date Value Ref Range Status  02/25/2022 101 (H) mg/dL (calc) Final    Comment:    Reference range: <100 . Desirable range <100 mg/dL for primary prevention;   <70 mg/dL for patients with CHD or diabetic patients  with > or = 2 CHD risk factors. Marland Kitchen LDL-C is now calculated using the Martin-Hopkins  calculation, which is a validated novel method providing  better accuracy than the Friedewald equation in the  estimation of LDL-C.  Cresenciano Genre et al. Annamaria Helling. 2409;735(32): 2061-2068  (http://education.Quest[employer].com/faq/FAQ164)    HDL  Date Value Ref Range Status  02/25/2022 80 > OR = 50 mg/dL Final   Triglycerides  Date Value Ref Range Status  02/25/2022 87 <150 mg/dL Final         Passed - Patient is not pregnant      Passed - Valid encounter within last 12 months    Recent Outpatient Visits          2 months ago Acute non-recurrent frontal sinusitis   [facility] [place], Devonne Doughty, DO   6 months ago Dry mouth   [facility] [place], Coralie Keens, NP      Future Appointments            Today Garnette Gunner, Coralie Keens, NP [facility], [facility]

## 2022-05-23 NOTE — Assessment & Plan Note (Signed)
Encourage diet and exercise weight loss 

## 2022-05-23 NOTE — Addendum Note (Signed)
Addended by: Ashley Royalty E on: 05/23/2022 04:43 PM   Modules accepted: Orders

## 2022-05-26 LAB — COMPLETE METABOLIC PANEL WITH GFR
AG Ratio: 2.1 (calc) (ref 1.0–2.5)
ALT: 19 U/L (ref 6–29)
AST: 18 U/L (ref 10–35)
Albumin: 4.5 g/dL (ref 3.6–5.1)
Alkaline phosphatase (APISO): 71 U/L (ref 37–153)
BUN: 19 mg/dL (ref 7–25)
CO2: 23 mmol/L (ref 20–32)
Calcium: 9.6 mg/dL (ref 8.6–10.4)
Chloride: 106 mmol/L (ref 98–110)
Creat: 0.81 mg/dL (ref 0.50–1.03)
Globulin: 2.1 g/dL (calc) (ref 1.9–3.7)
Glucose, Bld: 90 mg/dL (ref 65–99)
Potassium: 3.8 mmol/L (ref 3.5–5.3)
Sodium: 139 mmol/L (ref 135–146)
Total Bilirubin: 0.6 mg/dL (ref 0.2–1.2)
Total Protein: 6.6 g/dL (ref 6.1–8.1)
eGFR: 84 mL/min/{1.73_m2} (ref >=60)

## 2022-05-26 LAB — CBC
HCT: 40.9 % (ref 35.0–45.0)
Hemoglobin: 14.1 g/dL (ref 11.7–15.5)
MCH: 32.4 pg (ref 27.0–33.0)
MCHC: 34.5 g/dL (ref 32.0–36.0)
MCV: 94 fL (ref 80.0–100.0)
MPV: 10.3 fL (ref 7.5–12.5)
Platelets: 306 10*3/uL (ref 140–400)
RBC: 4.35 10*6/uL (ref 3.80–5.10)
RDW: 12.1 % (ref 11.0–15.0)
WBC: 5.3 10*3/uL (ref 3.8–10.8)

## 2022-05-26 LAB — HEPATITIS C ANTIBODY: Hepatitis C Ab: NONREACTIVE

## 2022-05-26 LAB — LIPID PANEL
Cholesterol: 252 mg/dL — ABNORMAL HIGH (ref <200)
HDL: 89 mg/dL (ref >=50)
LDL Cholesterol (Calc): 144 mg/dL (calc) — ABNORMAL HIGH
Non-HDL Cholesterol (Calc): 163 mg/dL (calc) — ABNORMAL HIGH (ref <130)
Total CHOL/HDL Ratio: 2.8 (calc) (ref <5.0)
Triglycerides: 85 mg/dL (ref <150)

## 2022-05-26 LAB — HIV ANTIBODY (ROUTINE TESTING W REFLEX): HIV 1&2 Ab, 4th Generation: NONREACTIVE

## 2022-06-15 IMAGING — MG DIGITAL SCREENING BILAT W/ TOMO W/ CAD
6 of 10 series · 6 of 30 positions shown · non-contrast
Comparison: Previous exam(s).

CLINICAL DATA: Screening.

EXAM:
DIGITAL SCREENING BILATERAL MAMMOGRAM WITH TOMO AND CAD

[L XCCL synth-2D]
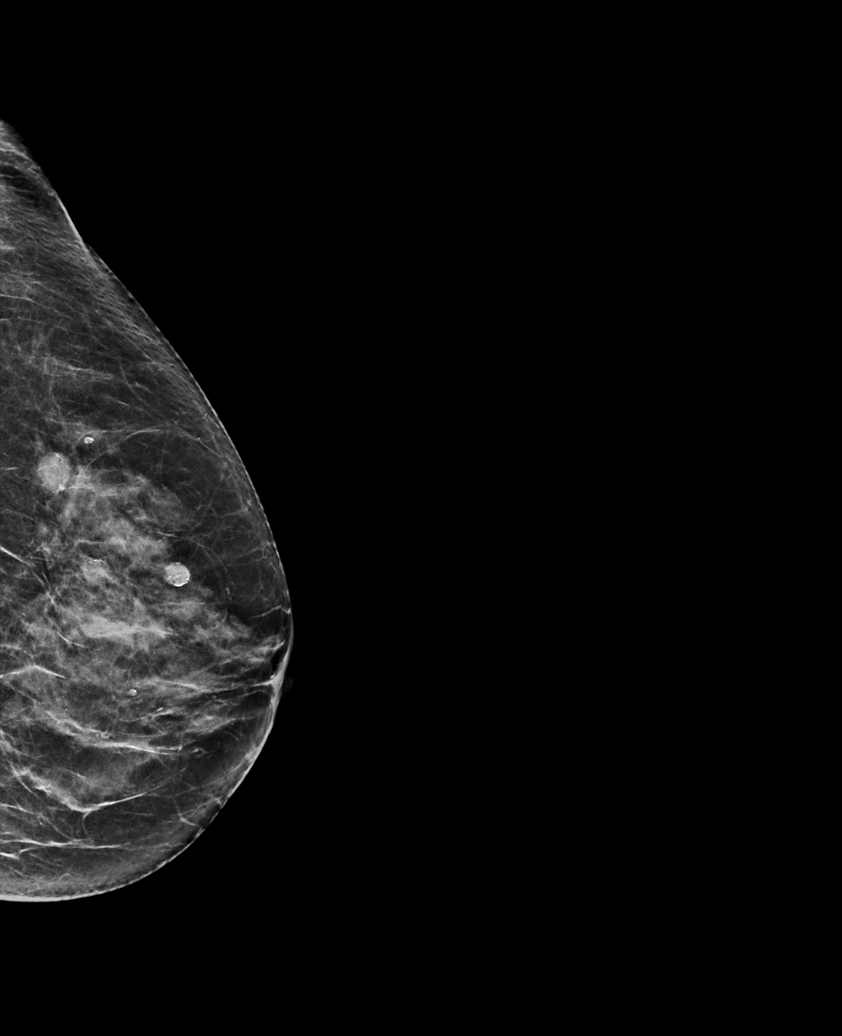

[L MLO synth-2D]
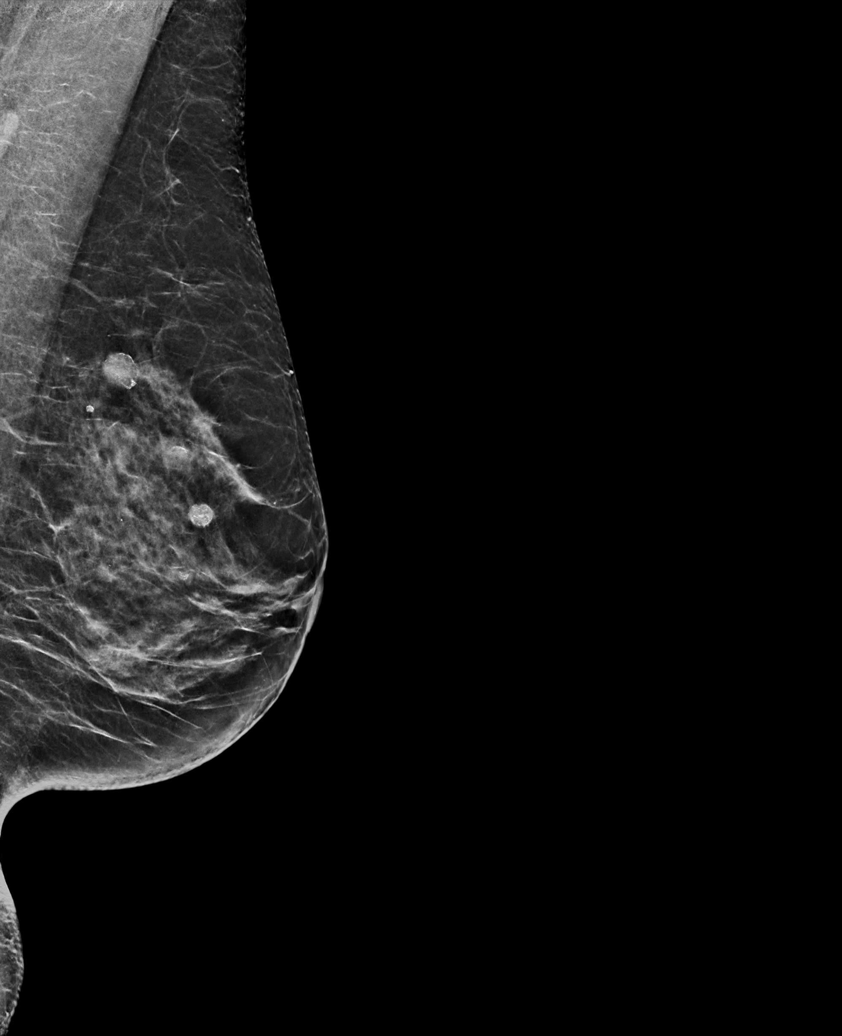

[L CC synth-2D]
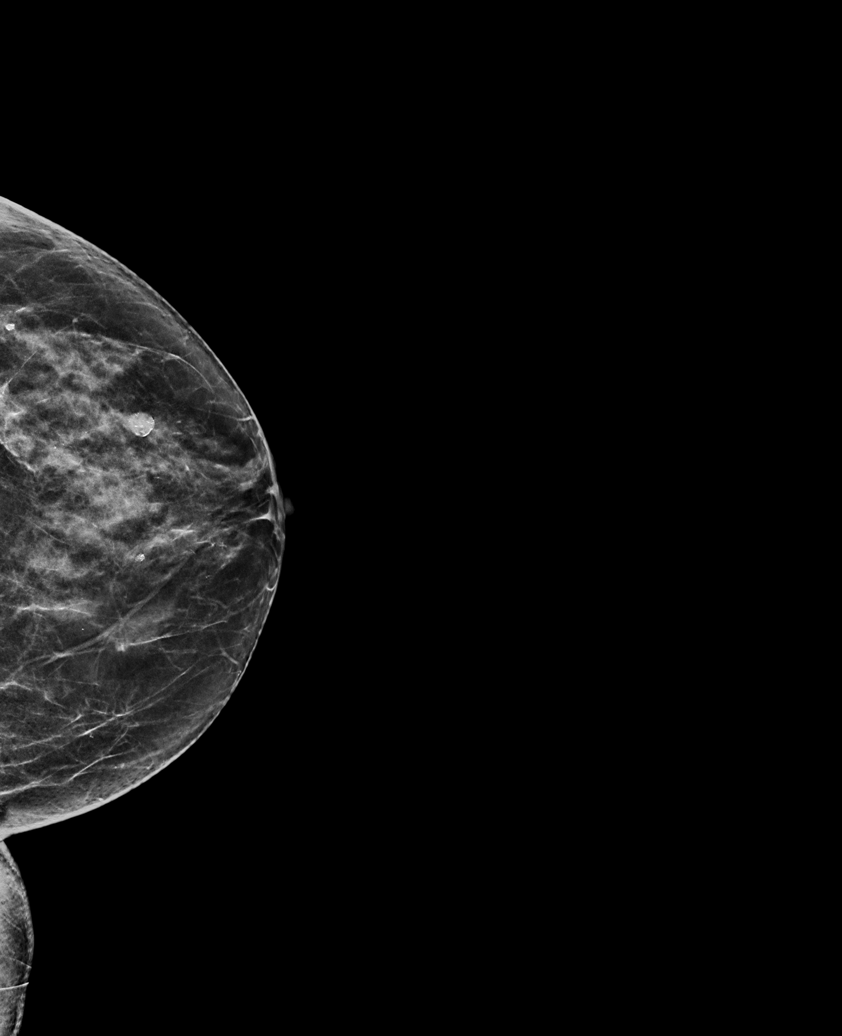

[R CC synth-2D]
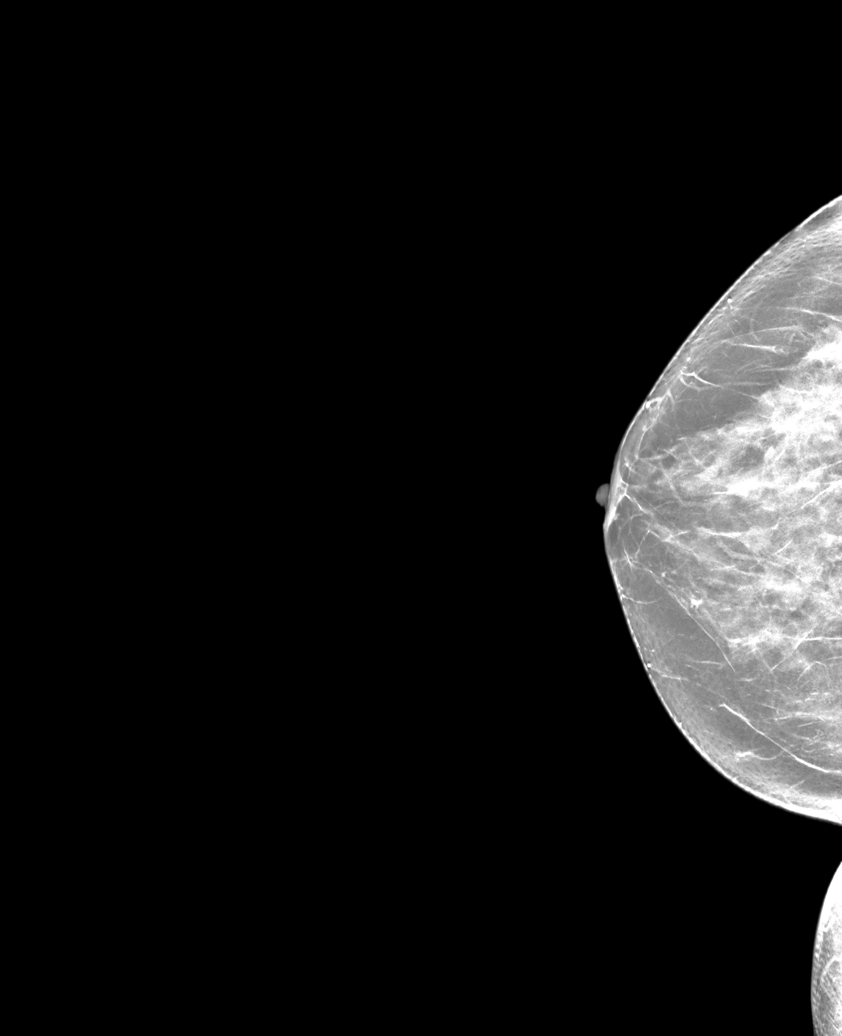

[R MLO synth-2D]
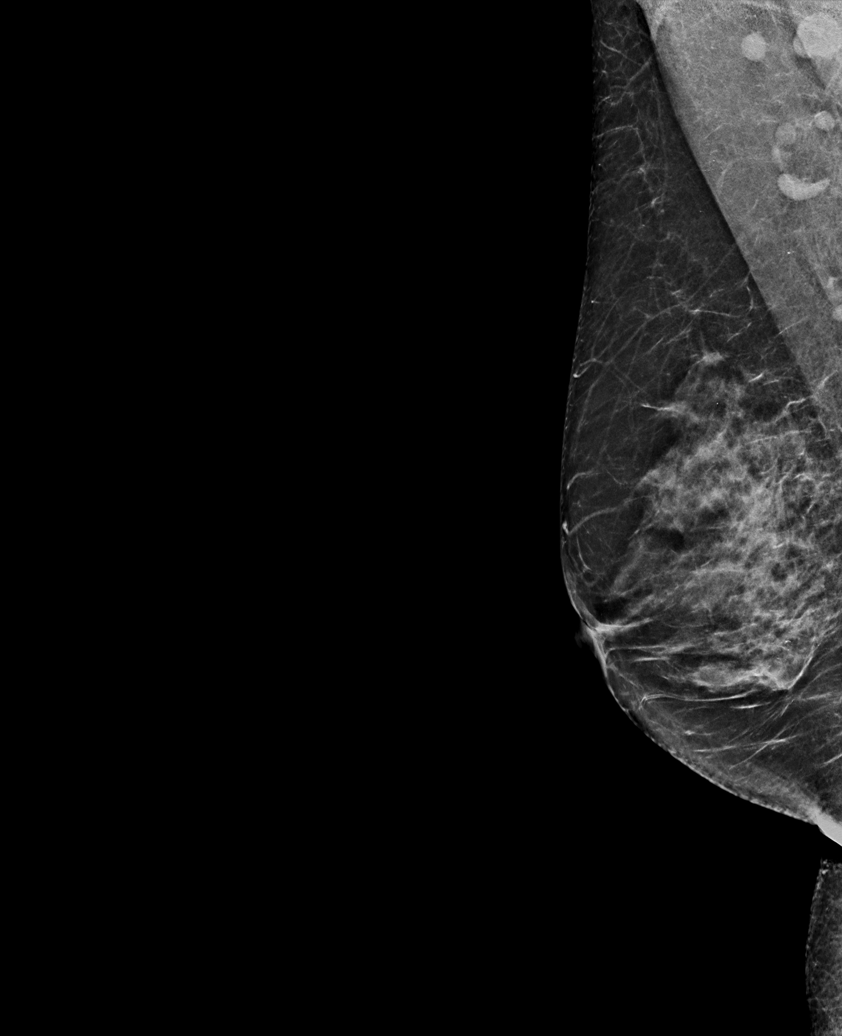

[R MLO tomo · tomo slice 31/61.0]
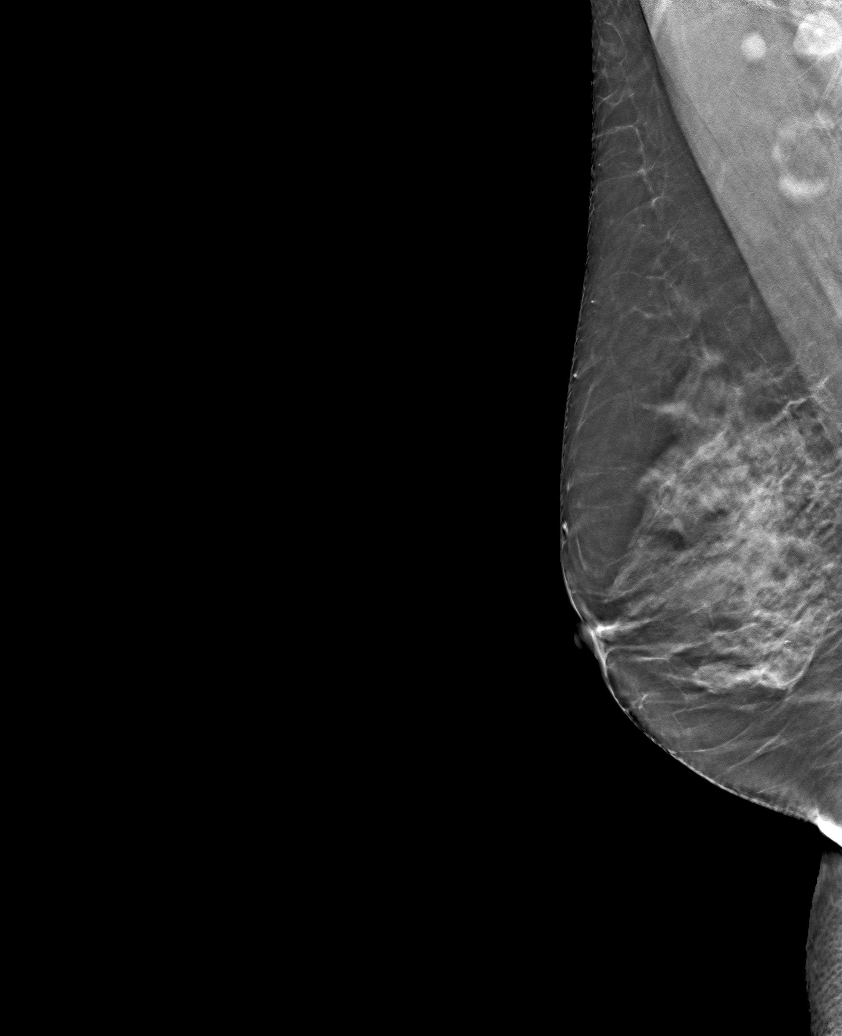

[6 of 30 positions shown; findings below may reference images not displayed]

ACR Breast Density Category c: The breast tissue is heterogeneously
dense, which may obscure small masses.
FINDINGS: In the right breast, calcifications warrant further evaluation with
magnified views. In the left breast, no findings suspicious for
malignancy. Images were processed with CAD.
IMPRESSION: Further evaluation is suggested for calcifications in the right
breast.

RECOMMENDATION:
Diagnostic mammogram of the right breast. (Code:[id number])

The patient will be contacted regarding the findings, and additional
imaging will be scheduled.

BI-RADS CATEGORY  0: Incomplete. Need additional imaging evaluation
and/or prior mammograms for comparison.

## 2022-06-18 ENCOUNTER — Ambulatory Visit
Admission: RE | Admit: 2022-06-18 | Discharge: 2022-06-18 | Disposition: A | Discharge status: Home / Self Care | Payer: BC Managed Care – PPO | Source: Ambulatory Visit | Attending: Internal Medicine | Admitting: Internal Medicine

## 2022-06-18 ENCOUNTER — Ambulatory Visit: Admission: RE | Admit: 2022-06-18 | Payer: BC Managed Care – PPO | Source: Ambulatory Visit

## 2022-06-18 DIAGNOSIS — Z1231 Encounter for screening mammogram for malignant neoplasm of breast: Secondary | ICD-10-CM | POA: Insufficient documentation

## 2022-06-18 DIAGNOSIS — R251 Tremor, unspecified: Secondary | ICD-10-CM | POA: Diagnosis not present

## 2022-06-27 DIAGNOSIS — L600 Ingrowing nail: Secondary | ICD-10-CM | POA: Diagnosis not present

## 2022-06-27 DIAGNOSIS — M79674 Pain in right toe(s): Secondary | ICD-10-CM | POA: Diagnosis not present

## 2022-06-27 DIAGNOSIS — M79675 Pain in left toe(s): Secondary | ICD-10-CM | POA: Diagnosis not present

## 2022-06-27 DIAGNOSIS — L603 Nail dystrophy: Secondary | ICD-10-CM | POA: Diagnosis not present

## 2022-07-07 ENCOUNTER — Ambulatory Visit
Admission: RE | Admit: 2022-07-07 | Discharge: 2022-07-07 | Disposition: A | Discharge status: Home / Self Care | Payer: BC Managed Care – PPO | Source: Ambulatory Visit | Attending: Internal Medicine | Admitting: Internal Medicine

## 2022-07-07 DIAGNOSIS — M8589 Other specified disorders of bone density and structure, multiple sites: Secondary | ICD-10-CM | POA: Diagnosis not present

## 2022-07-07 DIAGNOSIS — Z780 Asymptomatic menopausal state: Secondary | ICD-10-CM | POA: Diagnosis not present

## 2022-08-19 ENCOUNTER — Other Ambulatory Visit: Payer: Self-pay | Admitting: Internal Medicine

## 2022-08-19 NOTE — Telephone Encounter (Signed)
Requested medication (s) are due for refill today: yes  Requested medication (s) are on the active medication list: yes  Last refill:  05/23/22 #30 2 refills  Future visit scheduled: no   Notes to clinic:  no refills remain. Do you want to give 90 day supply?     Requested Prescriptions  Pending Prescriptions Disp Refills   atorvastatin (LIPITOR) 10 MG tablet [Pharmacy Med Name: ATORVASTATIN '10MG'$  TABLETS] 30 tablet 2    Sig: TAKE 1 TABLET(10 MG) BY MOUTH DAILY     Cardiovascular:  Antilipid - Statins Failed - 08/19/2022  3:32 AM      Failed - Lipid Panel in normal range within the last 12 months    Cholesterol  Date Value Ref Range Status  05/23/2022 252 (H) <200 mg/dL Final   LDL Cholesterol (Calc)  Date Value Ref Range Status  05/23/2022 144 (H) mg/dL (calc) Final    Comment:    Reference range: <100 . Desirable range <100 mg/dL for primary prevention;   <70 mg/dL for patients with CHD or diabetic patients  with > or = 2 CHD risk factors. Marland Kitchen LDL-C is now calculated using the Martin-Hopkins  calculation, which is a validated novel method providing  better accuracy than the Friedewald equation in the  estimation of LDL-C.  Cresenciano Genre et al. Annamaria Helling. 3254;982(64): 2061-2068  (http://education.[facility].com/faq/FAQ164)    HDL  Date Value Ref Range Status  05/23/2022 89 > OR = 50 mg/dL Final   Triglycerides  Date Value Ref Range Status  05/23/2022 85 <150 mg/dL Final         Passed - Patient is not pregnant      Passed - Valid encounter within last 12 months    Recent Outpatient Visits           2 months ago Encounter for general adult medical examination with abnormal findings   [facility] [place], Coralie Keens, NP   5 months ago Acute non-recurrent frontal sinusitis   [place], DO   9 months ago Dry mouth   [facility] [place], Coralie Keens, [place]

## 2022-09-16 DIAGNOSIS — F411 Generalized anxiety disorder: Secondary | ICD-10-CM | POA: Diagnosis not present

## 2022-11-20 ENCOUNTER — Other Ambulatory Visit: Payer: Self-pay | Admitting: Internal Medicine

## 2022-11-20 MED ORDER — ATORVASTATIN CALCIUM 10 MG PO TABS: ORAL_TABLET | ORAL | 0 refills | Status: DC

## 2022-11-20 NOTE — Telephone Encounter (Signed)
Requested Prescriptions  Pending Prescriptions Disp Refills   atorvastatin (LIPITOR) 10 MG tablet 90 tablet 0    Sig: TAKE 1 TABLET(10 MG) BY MOUTH DAILY     Cardiovascular:  Antilipid - Statins Failed - 11/20/2022 10:44 AM      Failed - Lipid Panel in normal range within the last 12 months    Cholesterol  Date Value Ref Range Status  05/23/2022 252 (H) <200 mg/dL Final   LDL Cholesterol (Calc)  Date Value Ref Range Status  05/23/2022 144 (H) mg/dL (calc) Final    Comment:    Reference range: <100 . Desirable range <100 mg/dL for primary prevention;   <70 mg/dL for patients with CHD or diabetic patients  with > or = 2 CHD risk factors. Marland Kitchen LDL-C is now calculated using the Martin-Hopkins  calculation, which is a validated novel method providing  better accuracy than the Friedewald equation in the  estimation of LDL-C.  Cresenciano Genre et al. Annamaria Helling. [id number]): 2061-2068  (http://education.[facility].com/faq/FAQ164)    HDL  Date Value Ref Range Status  05/23/2022 89 > OR = 50 mg/dL Final   Triglycerides  Date Value Ref Range Status  05/23/2022 85 <150 mg/dL Final         Passed - Patient is not pregnant      Passed - Valid encounter within last 12 months    Recent Outpatient Visits           6 months ago Encounter for general adult medical examination with abnormal findings   [facility] [place], Coralie Keens, NP   8 months ago Acute non-recurrent frontal sinusitis   [facility] Olin Hauser, DO   1 year ago Dry mouth   [facility] [place], Coralie Keens, [place]

## 2022-11-20 NOTE — Telephone Encounter (Signed)
Medication Refill - Medication: atorvastatin (LIPITOR) 10 MG tablet   Has the patient contacted their pharmacy? yes (Agent: If no, request that the patient contact the pharmacy for the refill. If patient does not wish to contact the pharmacy document the reason why and proceed with request.) (Agent: If yes, when and what did the pharmacy advise?)contact pcp Patient says requested from pharmacy yesterday and it was denied, didn't say why   Preferred Pharmacy (with phone number or street name):  [id number], [place] [address] AT [place] [address] Phone: [phone] Fax: [phone] Has the patient been seen for an appointment in the last year OR does the patient have an upcoming appointment? yes  Agent: Please be advised that RX refills may take up to 3 business days. We ask that you follow-up with your pharmacy.

## 2023-02-17 ENCOUNTER — Other Ambulatory Visit: Payer: Self-pay | Admitting: Internal Medicine

## 2023-02-17 NOTE — Telephone Encounter (Signed)
Called pt and LM on VM to call back to schedule appt. Per OV 05/23/22, PCP requested pt f/u in 6 months (which was March). Last Lipid profile 9/23 will refill to last until date due= 90 days.   Requested Prescriptions  Pending Prescriptions Disp Refills   atorvastatin (LIPITOR) 10 MG tablet [Pharmacy Med Name: ATORVASTATIN 10MG  TABLETS] 90 tablet 0    Sig: TAKE 1 TABLET(10 MG) BY MOUTH DAILY     Cardiovascular:  Antilipid - Statins Failed - 02/17/2023  3:33 AM      Failed - Lipid Panel in normal range within the last 12 months    Cholesterol  Date Value Ref Range Status  05/23/2022 252 (H) <200 mg/dL Final   LDL Cholesterol (Calc)  Date Value Ref Range Status  05/23/2022 144 (H) mg/dL (calc) Final    Comment:    Reference range: <100 . Desirable range <100 mg/dL for primary prevention;   <70 mg/dL for patients with CHD or diabetic patients  with > or = 2 CHD risk factors. Marland Kitchen LDL-C is now calculated using the Martin-Hopkins  calculation, which is a validated novel method providing  better accuracy than the Friedewald equation in the  estimation of LDL-C.  Horald Pollen et al. Lenox Ahr. 2956;213(08): 2061-2068  (http://education.[facility].com/faq/FAQ164)    HDL  Date Value Ref Range Status  05/23/2022 89 > OR = 50 mg/dL Final   Triglycerides  Date Value Ref Range Status  05/23/2022 85 <150 mg/dL Final         Passed - Patient is not pregnant      Passed - Valid encounter within last 12 months    Recent Outpatient Visits           9 months ago Encounter for general adult medical examination with abnormal findings   [place] [facility] [place], Salvadore [place], NP   11 months ago Acute non-recurrent frontal sinusitis   [place] [facility] Smitty Cords, DO   1 year ago Dry mouth   [place] [facility] [place], Salvadore [place], [place]

## 2023-02-24 ENCOUNTER — Ambulatory Visit (INDEPENDENT_AMBULATORY_CARE_PROVIDER_SITE_OTHER): Payer: BC Managed Care – PPO | Admitting: Internal Medicine

## 2023-02-24 VITALS — BP 104/62 | HR 63 | Temp 97.3°F | Wt 136.0 lb

## 2023-02-24 DIAGNOSIS — J4520 Mild intermittent asthma, uncomplicated: Secondary | ICD-10-CM

## 2023-02-24 DIAGNOSIS — E663 Overweight: Secondary | ICD-10-CM

## 2023-02-24 DIAGNOSIS — F419 Anxiety disorder, unspecified: Secondary | ICD-10-CM

## 2023-02-24 DIAGNOSIS — M8580 Other specified disorders of bone density and structure, unspecified site: Secondary | ICD-10-CM | POA: Insufficient documentation

## 2023-02-24 DIAGNOSIS — K219 Gastro-esophageal reflux disease without esophagitis: Secondary | ICD-10-CM

## 2023-02-24 DIAGNOSIS — N3020 Other chronic cystitis without hematuria: Secondary | ICD-10-CM

## 2023-02-24 DIAGNOSIS — E7800 Pure hypercholesterolemia, unspecified: Secondary | ICD-10-CM

## 2023-02-24 DIAGNOSIS — Z6825 Body mass index (BMI) 25.0-25.9, adult: Secondary | ICD-10-CM

## 2023-02-24 DIAGNOSIS — R7309 Other abnormal glucose: Secondary | ICD-10-CM

## 2023-02-24 DIAGNOSIS — F32A Depression, unspecified: Secondary | ICD-10-CM

## 2023-02-24 DIAGNOSIS — G250 Essential tremor: Secondary | ICD-10-CM

## 2023-02-24 DIAGNOSIS — M5030 Other cervical disc degeneration, unspecified cervical region: Secondary | ICD-10-CM

## 2023-02-24 DIAGNOSIS — M8589 Other specified disorders of bone density and structure, multiple sites: Secondary | ICD-10-CM

## 2023-02-24 MED ORDER — PROPRANOLOL HCL ER 80 MG PO CP24: 80.0000 mg | ORAL_CAPSULE | Freq: Every day | ORAL | 1 refills | Status: DC

## 2023-02-24 MED ORDER — DOXYCYCLINE HYCLATE 100 MG PO CAPS: 100.0000 mg | ORAL_CAPSULE | Freq: Two times a day (BID) | ORAL | 0 refills | Status: DC

## 2023-02-24 NOTE — Patient Instructions (Signed)

## 2023-02-24 NOTE — Assessment & Plan Note (Signed)
C-Met and lipid profile today Encouraged her to consume a low-fat diet Continue atorvastatin 

## 2023-02-24 NOTE — Assessment & Plan Note (Signed)
Continue albuterol as needed 

## 2023-02-24 NOTE — Assessment & Plan Note (Signed)
She is taking dexlansoprazole only as needed Monitor for increased difficulty swallowing

## 2023-02-24 NOTE — Assessment & Plan Note (Signed)
Discussed the importance of taking calcium in addition to vitamin D Encourage daily weightbearing exercise

## 2023-02-24 NOTE — Assessment & Plan Note (Signed)
She is not currently taking any medication for this Encourage daily stretching

## 2023-02-24 NOTE — Assessment & Plan Note (Signed)
Continue doxycycline as needed

## 2023-02-24 NOTE — Assessment & Plan Note (Signed)
Stable on citalopram and clonazepam Support offered
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
## 2023-02-24 NOTE — Assessment & Plan Note (Signed)
Encourage diet and exercise for weight loss 

## 2023-02-24 NOTE — Assessment & Plan Note (Signed)
Controlled on propranolol, refill today She no longer wants to follow with neurology

## 2023-02-25 LAB — HEMOGLOBIN A1C
Hgb A1c MFr Bld: 5.3 % of total Hgb (ref <5.7)
Mean Plasma Glucose: 105 mg/dL
eAG (mmol/L): 5.8 mmol/L

## 2023-02-25 LAB — LIPID PANEL
Cholesterol: 185 mg/dL (ref <200)
HDL: 90 mg/dL (ref >=50)
LDL Cholesterol (Calc): 75 mg/dL (calc)
Non-HDL Cholesterol (Calc): 95 mg/dL (calc) (ref <130)
Total CHOL/HDL Ratio: 2.1 (calc) (ref <5.0)
Triglycerides: 112 mg/dL (ref <150)

## 2023-02-25 LAB — CBC
HCT: 40.1 % (ref 35.0–45.0)
Hemoglobin: 13.5 g/dL (ref 11.7–15.5)
MCH: 31.6 pg (ref 27.0–33.0)
MCHC: 33.7 g/dL (ref 32.0–36.0)
MCV: 93.9 fL (ref 80.0–100.0)
MPV: 10.5 fL (ref 7.5–12.5)
Platelets: 271 10*3/uL (ref 140–400)
RBC: 4.27 10*6/uL (ref 3.80–5.10)
RDW: 12 % (ref 11.0–15.0)
WBC: 5 10*3/uL (ref 3.8–10.8)

## 2023-02-25 LAB — COMPLETE METABOLIC PANEL WITH GFR
AG Ratio: 2 (calc) (ref 1.0–2.5)
ALT: 15 U/L (ref 6–29)
AST: 16 U/L (ref 10–35)
Albumin: 4.3 g/dL (ref 3.6–5.1)
Alkaline phosphatase (APISO): 71 U/L (ref 37–153)
BUN: 19 mg/dL (ref 7–25)
CO2: 26 mmol/L (ref 20–32)
Calcium: 9.9 mg/dL (ref 8.6–10.4)
Chloride: 105 mmol/L (ref 98–110)
Creat: 0.82 mg/dL (ref 0.50–1.03)
Globulin: 2.2 g/dL (calc) (ref 1.9–3.7)
Glucose, Bld: 106 mg/dL (ref 65–139)
Potassium: 4.2 mmol/L (ref 3.5–5.3)
Sodium: 139 mmol/L (ref 135–146)
Total Bilirubin: 0.5 mg/dL (ref 0.2–1.2)
Total Protein: 6.5 g/dL (ref 6.1–8.1)
eGFR: 82 mL/min/{1.73_m2} (ref >=60)

## 2023-04-28 ENCOUNTER — Ambulatory Visit (INDEPENDENT_AMBULATORY_CARE_PROVIDER_SITE_OTHER): Payer: BC Managed Care – PPO

## 2023-04-28 DIAGNOSIS — Z23 Encounter for immunization: Secondary | ICD-10-CM

## 2023-05-18 ENCOUNTER — Other Ambulatory Visit: Payer: Self-pay | Admitting: Internal Medicine

## 2023-05-19 NOTE — Telephone Encounter (Signed)
Requested by interface surescripts. Future visit in 1 week .  Requested Prescriptions  Pending Prescriptions Disp Refills   atorvastatin (LIPITOR) 10 MG tablet [Pharmacy Med Name: ATORVASTATIN 10MG  TABLETS] 90 tablet 0    Sig: TAKE 1 TABLET(10 MG) BY MOUTH DAILY     Cardiovascular:  Antilipid - Statins Failed - 05/18/2023  3:32 AM      Failed - Lipid Panel in normal range within the last 12 months    Cholesterol  Date Value Ref Range Status  02/24/2023 185 <200 mg/dL Final   LDL Cholesterol (Calc)  Date Value Ref Range Status  02/24/2023 75 mg/dL (calc) Final    Comment:    Reference range: <100 . Desirable range <100 mg/dL for primary prevention;   <70 mg/dL for patients with CHD or diabetic patients  with > or = 2 CHD risk factors. Marland Kitchen LDL-C is now calculated using the Martin-Hopkins  calculation, which is a validated novel method providing  better accuracy than the Friedewald equation in the  estimation of LDL-C.  Horald Pollen et al. Lenox Ahr. 1610;960(45): 2061-2068  (http://education.[employer].com/faq/FAQ164)    HDL  Date Value Ref Range Status  02/24/2023 90 > OR = 50 mg/dL Final   Triglycerides  Date Value Ref Range Status  02/24/2023 112 <150 mg/dL Final         Passed - Patient is not pregnant      Passed - Valid encounter within last 12 months    Recent Outpatient Visits           2 months ago High cholesterol   [place] [facility] [place], Salvadore Oxford, NP   12 months ago Encounter for general adult medical examination with abnormal findings   [place] [facility], Salvadore Oxford, NP   1 year ago Acute non-recurrent frontal sinusitis   [place] [facility] Smitty Cords, DO   1 year ago Dry mouth   [place] [facility] [place], Salvadore Oxford, NP       Future Appointments             In 1 week [place] Si, Salvadore Oxford, NP [facility], [facility]

## 2023-05-26 ENCOUNTER — Encounter: Payer: Self-pay | Admitting: Internal Medicine

## 2023-05-26 ENCOUNTER — Ambulatory Visit (INDEPENDENT_AMBULATORY_CARE_PROVIDER_SITE_OTHER): Payer: BC Managed Care – PPO | Admitting: Internal Medicine

## 2023-05-26 VITALS — BP 116/64 | HR 55 | Temp 96.6°F | Wt 137.0 lb

## 2023-05-26 DIAGNOSIS — Z0001 Encounter for general adult medical examination with abnormal findings: Secondary | ICD-10-CM | POA: Diagnosis not present

## 2023-05-26 DIAGNOSIS — M5030 Other cervical disc degeneration, unspecified cervical region: Secondary | ICD-10-CM | POA: Diagnosis not present

## 2023-05-26 DIAGNOSIS — Z6825 Body mass index (BMI) 25.0-25.9, adult: Secondary | ICD-10-CM

## 2023-05-26 DIAGNOSIS — E663 Overweight: Secondary | ICD-10-CM | POA: Diagnosis not present

## 2023-05-26 DIAGNOSIS — Z1231 Encounter for screening mammogram for malignant neoplasm of breast: Secondary | ICD-10-CM | POA: Diagnosis not present

## 2023-05-26 MED ORDER — CYCLOBENZAPRINE HCL 5 MG PO TABS: 5.0000 mg | ORAL_TABLET | Freq: Every evening | ORAL | 0 refills | Status: DC | PRN

## 2023-05-26 NOTE — Patient Instructions (Signed)
Health Maintenance for Postmenopausal Women Menopause is a normal process in which your ability to get pregnant comes to an end. This process happens slowly over many months or years, usually between the ages of 48 and 55. Menopause is complete when you have missed your menstrual period for 12 months. It is important to talk with your health care provider about some of the most common conditions that affect women after menopause (postmenopausal women). These include heart disease, cancer, and bone loss (osteoporosis). Adopting a healthy lifestyle and getting preventive care can help to promote your health and wellness. The actions you take can also lower your chances of developing some of these common conditions. What are the signs and symptoms of menopause? During menopause, you may have the following symptoms: Hot flashes. These can be moderate or severe. Night sweats. Decrease in sex drive. Mood swings. Headaches. Tiredness (fatigue). Irritability. Memory problems. Problems falling asleep or staying asleep. Talk with your health care provider about treatment options for your symptoms. Do I need hormone replacement therapy? Hormone replacement therapy is effective in treating symptoms that are caused by menopause, such as hot flashes and night sweats. Hormone replacement carries certain risks, especially as you become older. If you are thinking about using estrogen or estrogen with progestin, discuss the benefits and risks with your health care provider. How can I reduce my risk for heart disease and stroke? The risk of heart disease, heart attack, and stroke increases as you age. One of the causes may be a change in the body's hormones during menopause. This can affect how your body uses dietary fats, triglycerides, and cholesterol. Heart attack and stroke are medical emergencies. There are many things that you can do to help prevent heart disease and stroke. Watch your blood pressure High  blood pressure causes heart disease and increases the risk of stroke. This is more likely to develop in people who have high blood pressure readings or are overweight. Have your blood pressure checked: Every 3-5 years if you are 18-39 years of age. Every year if you are 40 years old or older. Eat a healthy diet  Eat a diet that includes plenty of vegetables, fruits, low-fat dairy products, and lean protein. Do not eat a lot of foods that are high in solid fats, added sugars, or sodium. Get regular exercise Get regular exercise. This is one of the most important things you can do for your health. Most adults should: Try to exercise for at least 150 minutes each week. The exercise should increase your heart rate and make you sweat (moderate-intensity exercise). Try to do strengthening exercises at least twice each week. Do these in addition to the moderate-intensity exercise. Spend less time sitting. Even light physical activity can be beneficial. Other tips Work with your health care provider to achieve or maintain a healthy weight. Do not use any products that contain nicotine or tobacco. These products include cigarettes, chewing tobacco, and vaping devices, such as e-cigarettes. If you need help quitting, ask your health care provider. Know your numbers. Ask your health care provider to check your cholesterol and your blood sugar (glucose). Continue to have your blood tested as directed by your health care provider. Do I need screening for cancer? Depending on your health history and family history, you may need to have cancer screenings at different stages of your life. This may include screening for: Breast cancer. Cervical cancer. Lung cancer. Colorectal cancer. What is my risk for osteoporosis? After menopause, you may be   at increased risk for osteoporosis. Osteoporosis is a condition in which bone destruction happens more quickly than new bone creation. To help prevent osteoporosis or  the bone fractures that can happen because of osteoporosis, you may take the following actions: If you are 19-50 years old, get at least 1,000 mg of calcium and at least 600 international units (IU) of vitamin D per day. If you are older than age 50 but younger than age 70, get at least 1,200 mg of calcium and at least 600 international units (IU) of vitamin D per day. If you are older than age 70, get at least 1,200 mg of calcium and at least 800 international units (IU) of vitamin D per day. Smoking and drinking excessive alcohol increase the risk of osteoporosis. Eat foods that are rich in calcium and vitamin D, and do weight-bearing exercises several times each week as directed by your health care provider. How does menopause affect my mental health? Depression may occur at any age, but it is more common as you become older. Common symptoms of depression include: Feeling depressed. Changes in sleep patterns. Changes in appetite or eating patterns. Feeling an overall lack of motivation or enjoyment of activities that you previously enjoyed. Frequent crying spells. Talk with your health care provider if you think that you are experiencing any of these symptoms. General instructions See your health care provider for regular wellness exams and vaccines. This may include: Scheduling regular health, dental, and eye exams. Getting and maintaining your vaccines. These include: Influenza vaccine. Get this vaccine each year before the flu season begins. Pneumonia vaccine. Shingles vaccine. Tetanus, diphtheria, and pertussis (Tdap) booster vaccine. Your health care provider may also recommend other immunizations. Tell your health care provider if you have ever been abused or do not feel safe at home. Summary Menopause is a normal process in which your ability to get pregnant comes to an end. This condition causes hot flashes, night sweats, decreased interest in sex, mood swings, headaches, or lack  of sleep. Treatment for this condition may include hormone replacement therapy. Take actions to keep yourself healthy, including exercising regularly, eating a healthy diet, watching your weight, and checking your blood pressure and blood sugar levels. Get screened for cancer and depression. Make sure that you are up to date with all your vaccines. This information is not intended to replace advice given to you by your health care provider. Make sure you discuss any questions you have with your health care provider. Document Revised: 01/07/2021 Document Reviewed: 01/07/2021 Elsevier Patient Education  2024 [employer]  

## 2023-05-26 NOTE — Assessment & Plan Note (Signed)
With associated muscle spasms We will try Flexeril 5 mg nightly as needed She reports massage is too painful for her Encouraged heat and stretching

## 2023-05-26 NOTE — Assessment & Plan Note (Signed)
Encourage diet and exercise for weight loss 

## 2023-05-26 NOTE — Progress Notes (Signed)
Subjective:    Patient ID: Evelyn Choi, female    DOB: 1966-06-10, 57 y.o.   MRN: [id number]  HPI  Patient presents to clinic today for her annual exam.  Flu: 05/2022 Tetanus: 04/2023 COVID: X 2 Pneumovax: 05/2022 Shingrix: 10/2021, 11/2021 Pap smear: 09/2018 Mammogram: 06/2022 Bone density: 07/2022 Colon screening: 01/2022 Vision screening: as needed Dentist: biannually  Diet: She does eat meat. She consumes fruits and veggies. She tries to avoid fried foods. She drinks mostly sweet tea. Exercise: None  Review of Systems  Past Medical History:  Diagnosis Date   Asthma    BPPV (benign paroxysmal positional vertigo), unspecified laterality    2-3 episodes/yr   GERD (gastroesophageal reflux disease)    no current issues   Herniated disc, cervical    no limitations    Current Outpatient Medications  Medication Sig Dispense Refill   albuterol (PROVENTIL HFA;VENTOLIN HFA) 108 (90 Base) MCG/ACT inhaler Inhale into the lungs every 6 (six) hours as needed for wheezing or shortness of breath.     Ascorbic Acid (VITAMIN C PO) Take by mouth daily.     atorvastatin (LIPITOR) 10 MG tablet TAKE 1 TABLET(10 MG) BY MOUTH DAILY 90 tablet 0   citalopram (CELEXA) 10 MG tablet Take by mouth. (Patient not taking: Reported on 02/24/2023)     Dexlansoprazole 30 MG capsule DR Take 1 capsule (30 mg total) by mouth daily. (Patient not taking: Reported on 02/24/2023) 90 capsule 1   doxycycline (VIBRAMYCIN) 100 MG capsule Take 1 capsule (100 mg total) by mouth 2 (two) times daily. 14 capsule 0   Omega-3 Fatty Acids (FISH OIL PO) Take by mouth daily.     propranolol ER (INDERAL LA) 80 MG 24 hr capsule Take 1 capsule (80 mg total) by mouth daily. 90 capsule 1   VITAMIN D PO Take by mouth daily.     No current facility-administered medications for this visit.    Allergies  Allergen Reactions   Macrobid [Nitrofurantoin] Shortness Of Breath and Rash    Chest Pains   Lexapro [Escitalopram Oxalate]  Anxiety    Family History  Problem Relation Age of Onset   Hypertension Mother    Depression Father    Hypothyroidism Father    Healthy Sister    Breast cancer Neg Hx    Colon cancer Neg Hx     Social History   Socioeconomic History   Marital status: Divorced    Spouse name: Not on file   Number of children: Not on file   Years of education: Not on file   Highest education level: Associate degree: academic program  Occupational History   Not on file  Tobacco Use   Smoking status: Never   Smokeless tobacco: Never  Vaping Use   Vaping status: Never Used  Substance and Sexual Activity   Alcohol use: Yes    Alcohol/week: 1.0 standard drink of alcohol    Types: 1 Cans of beer per week   Drug use: Never   Sexual activity: Yes    Birth control/protection: Post-menopausal  Other Topics Concern   Not on file  Social History Narrative   Not on file   Social Determinants of Health   Financial Resource Strain: Low Risk  (02/24/2023)   Overall Financial Resource Strain (CARDIA)    Difficulty of Paying Living Expenses: Not hard at all  Food Insecurity: No Food Insecurity (02/24/2023)   Hunger Vital Sign    Worried About Running Out of Food in  the Last Year: Never true    Ran Out of Food in the Last Year: Never true  Transportation Needs: No Transportation Needs (02/24/2023)   PRAPARE - Administrator, Civil Service (Medical): No    Lack of Transportation (Non-Medical): No  Physical Activity: Unknown (02/24/2023)   Exercise Vital Sign    Days of Exercise per Week: Patient declined    Minutes of Exercise per Session: Not on file  Stress: No Stress Concern Present (02/24/2023)   [employer] of Occupational Health - Occupational Stress Questionnaire    Feeling of Stress : Not at all  Social Connections: Moderately Integrated (02/24/2023)   Social Connection and Isolation Panel [NHANES]    Frequency of Communication with Friends and Family: More than three times  a week    Frequency of Social Gatherings with Friends and Family: Once a week    Attends Religious Services: More than 4 times per year    Active Member of [employer] or Organizations: No    Attends Engineer, structural: Not on file    Marital Status: Living with partner  Intimate Partner Violence: Not on file     Constitutional: Denies fever, malaise, fatigue, headache or abrupt weight changes.  HEENT: Denies eye pain, eye redness, ear pain, ringing in the ears, wax buildup, runny nose, nasal congestion, bloody nose, or sore throat. Respiratory: Denies difficulty breathing, shortness of breath, cough or sputum production.   Cardiovascular: Denies chest pain, chest tightness, palpitations or swelling in the hands or feet.  Gastrointestinal: Pt reports constipation. Denies abdominal pain, bloating, diarrhea or blood in the stool.  GU: Denies urgency, frequency, pain with urination, burning sensation, blood in urine, odor or discharge. Musculoskeletal: Denies decrease in range of motion, difficulty with gait, muscle pain or joint pain and swelling.  Skin: Denies redness, rashes, lesions or ulcercations.  Neurological: Patient reports tremor, chronic neck pain, intermittent lightheadedness.  Denies difficulty with memory, difficulty with speech or problems with balance and coordination.  Psych: Patient has a history of anxiety and depression.  Denies SI/HI.  No other specific complaints in a complete review of systems (except as listed in HPI above).     Objective:   Physical Exam  BP 116/64 (BP Location: Right Arm, Patient Position: Sitting, Cuff Size: Normal)   Pulse (!) 55   Temp (!) 96.6 F (35.9 C) (Temporal)   Wt 137 lb (62.1 kg)   SpO2 99%   BMI 25.89 kg/m   Wt Readings from Last 3 Encounters:  02/24/23 136 lb (61.7 kg)  05/23/22 134 lb 12.8 oz (61.1 kg)  03/19/22 136 lb 9.6 oz (62 kg)    General: Appears her stated age, overweight, in NAD. Skin: Warm, dry and  intact.  HEENT: Head: normal shape and size; Eyes: sclera white, no icterus, conjunctiva pink, PERRLA and EOMs intact;  Neck:  Neck supple, trachea midline. No masses, lumps or thyromegaly present.  Cardiovascular: Bradycardic with normal rhythm. S1,S2 noted.  No murmur, rubs or gallops noted. No JVD or BLE edema. No carotid bruits noted. Pulmonary/Chest: Normal effort and positive vesicular breath sounds. No respiratory distress. No wheezes, rales or ronchi noted.  Abdomen: Soft and nontender. Normal bowel sounds.  Musculoskeletal: There seems to be a bulge of the muscular tissue at the right lower neck at the insertion of the trapezius.  Strength 5/5 BUE/BLE. No difficulty with gait.  Neurological: Alert and oriented. Cranial nerves II-XII grossly intact. Coordination normal.  Psychiatric: Mood and affect  normal. Behavior is normal. Judgment and thought content normal.   BMET    Component Value Date/Time   NA 139 02/24/2023 1425   NA 139 04/30/2012 0838   K 4.2 02/24/2023 1425   K 4.1 04/30/2012 0838   CL 105 02/24/2023 1425   CL 107 04/30/2012 0838   CO2 26 02/24/2023 1425   CO2 23 04/30/2012 0838   GLUCOSE 106 02/24/2023 1425   GLUCOSE 133 (H) 04/30/2012 0838   BUN 19 02/24/2023 1425   BUN 14 04/30/2012 0838   CREATININE 0.82 02/24/2023 1425   CALCIUM 9.9 02/24/2023 1425   CALCIUM 9.1 04/30/2012 0838   GFRNONAA >60 03/05/2022 1138   GFRNONAA >60 04/30/2012 0838   GFRAA >60 04/30/2012 0838    Lipid Panel     Component Value Date/Time   CHOL 185 02/24/2023 1425   TRIG 112 02/24/2023 1425   HDL 90 02/24/2023 1425   CHOLHDL 2.1 02/24/2023 1425   LDLCALC 75 02/24/2023 1425    CBC    Component Value Date/Time   WBC 5.0 02/24/2023 1425   RBC 4.27 02/24/2023 1425   HGB 13.5 02/24/2023 1425   HGB 14.9 04/30/2012 0838   HCT 40.1 02/24/2023 1425   HCT 42.5 04/30/2012 0838   PLT 271 02/24/2023 1425   PLT 300 04/30/2012 0838   MCV 93.9 02/24/2023 1425   MCV 95 04/30/2012  0838   MCH 31.6 02/24/2023 1425   MCHC 33.7 02/24/2023 1425   RDW 12.0 02/24/2023 1425   RDW 12.9 04/30/2012 0838    Hgb A1C Lab Results  Component Value Date   HGBA1C 5.3 02/24/2023           Assessment & Plan:   Preventative health maintenance:  Flu shot UTD Tetanus UTD Encouraged her to get her COVID booster Shingrix UTD Pap smear UTD Mammogram ordered-she will call to schedule Bone density UTD Colon screening UTD Encouraged her to consume a balanced diet and exercise regimen Advised her to see an eye doctor and dentist annually Recent labs reviewed  RTC in 6 months, follow-up chronic conditions Nicki Reaper, NP

## 2023-08-28 ENCOUNTER — Other Ambulatory Visit: Payer: Self-pay | Admitting: Internal Medicine

## 2023-09-01 NOTE — Telephone Encounter (Signed)
 Requested Prescriptions  Pending Prescriptions Disp Refills   propranolol  ER (INDERAL  LA) 80 MG 24 hr capsule [Pharmacy Med Name: PROPRANOLOL  ER 80MG  CAPSULES] 90 capsule 0    Sig: TAKE 1 CAPSULE(80 MG) BY MOUTH DAILY     Cardiovascular:  Beta Blockers Passed - 09/01/2023  4:24 PM      Passed - Last BP in normal range    BP Readings from Last 1 Encounters:  05/26/23 116/64         Passed - Last Heart Rate in normal range    Pulse Readings from Last 1 Encounters:  05/26/23 (!) 55         Passed - Valid encounter within last 6 months    Recent Outpatient Visits           3 months ago Encounter for general adult medical examination with abnormal findings   [facility] [place], Angeline ORN, NP   6 months ago High cholesterol   [facility] [place], Angeline ORN, NP   1 year ago Encounter for general adult medical examination with abnormal findings   [facility] [place], Angeline ORN, NP   1 year ago Acute non-recurrent frontal sinusitis   [place] [facility] [place] Marsa PARAS, DO   1 year ago Dry mouth   [facility] [place], Angeline ORN, NP       Future Appointments             In 2 months Baity, Angeline ORN, NP [facility], [facility]

## 2023-09-08 ENCOUNTER — Ambulatory Visit
Admission: RE | Admit: 2023-09-08 | Discharge: 2023-09-08 | Disposition: A | Discharge status: Home / Self Care | Payer: BC Managed Care – PPO | Source: Ambulatory Visit | Attending: Internal Medicine | Admitting: Internal Medicine

## 2023-09-08 DIAGNOSIS — Z1231 Encounter for screening mammogram for malignant neoplasm of breast: Secondary | ICD-10-CM | POA: Insufficient documentation

## 2023-09-14 NOTE — Progress Notes (Signed)
 PCP: Antonette Angeline ORN, NP   Chief Complaint  Patient presents with   Gynecologic Exam    No concerns    HPI:      Ms. Evelyn Choi is a 58 y.o. G2P0010 whose LMP was No LMP recorded. Patient is postmenopausal., presents today for her annual examination.  Her menses are absent due to menopause, no PMB. She does not have vasomotor sx.   Sex activity: not sexually active due to pelvic pain with IC. Has pain/bleeding with sex in past.  Last Pap: 09/06/18  Results were: no abnormalities /neg HPV DNA.  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 Did valtrex  in past, doing Lysine currently without sx relief. Under increased stress.   Hx of IC, seen by [facility] urology in past. Treats with doxy BID for a few days when sx are really bad. Needs Rx RF. Hasn't done IC diet.   Last mammogram: 09/08/23 Results were: normal--routine follow-up in 12 months There is no FH of breast cancer. There is no FH of ovarian cancer. The patient does do self-breast exams. Hx of benign breast bx 2021.   Colonoscopy: 6/23 with [facility] with polyps and 2017;  Repeat due after 10 years now.   Tobacco use: The patient denies current or previous tobacco use. Alcohol use: social drinker No drug use Exercise: min active  She does get adequate calcium  and Vitamin D in her diet.  Labs with PCP.   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   Vaginal atrophy 09/15/2023   Osteopenia 02/24/2023   Overweight with body mass index (BMI) of 25 to 25.9 in adult 11/19/2021   DDD (degenerative disc disease), cervical 05/16/2019   High cholesterol 02/25/2018   Benign essential tremor 02/21/2016   Asthma without status asthmaticus 04/03/2015   IC (interstitial cystitis) 12/06/2014   Anxiety and depression 05/06/2011   GERD (gastroesophageal reflux disease) 05/06/2011    Past  Surgical History:  Procedure Laterality Date   BREAST BIOPSY Right 05/09/2020   stereo bx of calcs, x marker, path pending   COLONOSCOPY     COLONOSCOPY N/A 02/07/2022   Procedure: COLONOSCOPY;  Surgeon: Jinny Carmine, MD;  Location: [facility];  Service: Endoscopy;  Laterality: N/A;   COLONOSCOPY WITH PROPOFOL  N/A 07/09/2016   Procedure: COLONOSCOPY WITH PROPOFOL ;  Surgeon: Ruel Kung, MD;  Location: [facility];  Service: Endoscopy;  Laterality: N/A;   ESOPHAGOGASTRODUODENOSCOPY N/A 02/07/2022   Procedure: ESOPHAGOGASTRODUODENOSCOPY (EGD);  Surgeon: Jinny Carmine, MD;  Location: [facility];  Service: Endoscopy;  Laterality: N/A;   LASIK Bilateral 2020   TONSILLECTOMY     UTERINE FIBROID SURGERY      Family History  Problem Relation Age of Onset   Hypertension Mother    Depression Father    Hypothyroidism Father    Healthy Sister    Skin cancer Sister    Breast cancer Neg Hx    Colon cancer Neg Hx     Social History   Socioeconomic History   Marital status: Divorced    Spouse name: Not on file   Number of children: Not on file   Years of education: Not on file   Highest education level: Associate degree: academic program  Occupational History   Not on file  Tobacco Use   Smoking status: Never   Smokeless tobacco:  Never  Vaping Use   Vaping status: Never Used  Substance and Sexual Activity   Alcohol use: Yes    Alcohol/week: 1.0 standard drink of alcohol    Types: 1 Cans of beer per week    Comment: soc   Drug use: Never   Sexual activity: Not Currently    Birth control/protection: Post-menopausal  Other Topics Concern   Not on file  Social History Narrative   Not on file   Social Drivers of Health   Financial Resource Strain: Low Risk  (02/24/2023)   Overall Financial Resource Strain (CARDIA)    Difficulty of Paying Living Expenses: Not hard at all  Food Insecurity: No Food Insecurity (02/24/2023)   Hunger Vital Sign    Worried About  Running Out of Food in the Last Year: Never true    Ran Out of Food in the Last Year: Never true  Transportation Needs: No Transportation Needs (02/24/2023)   PRAPARE - Administrator, Civil Service (Medical): No    Lack of Transportation (Non-Medical): No  Physical Activity: Unknown (02/24/2023)   Exercise Vital Sign    Days of Exercise per Week: Patient declined    Minutes of Exercise per Session: Not on file  Stress: No Stress Concern Present (02/24/2023)   [employer] of Occupational Health - Occupational Stress Questionnaire    Feeling of Stress : Not at all  Social Connections: Moderately Integrated (02/24/2023)   Social Connection and Isolation Panel [NHANES]    Frequency of Communication with Friends and Family: More than three times a week    Frequency of Social Gatherings with Friends and Family: Once a week    Attends Religious Services: More than 4 times per year    Active Member of [employer] or Organizations: No    Attends Engineer, Structural: Not on file    Marital Status: Living with partner  Intimate Partner Violence: Not on file     Current Outpatient Medications:    albuterol (PROVENTIL HFA;VENTOLIN HFA) 108 (90 Base) MCG/ACT inhaler, Inhale into the lungs every 6 (six) hours as needed for wheezing or shortness of breath., Disp: , Rfl:    atorvastatin  (LIPITOR) 10 MG tablet, TAKE 1 TABLET(10 MG) BY MOUTH DAILY, Disp: 90 tablet, Rfl: 0   Calcium  Carbonate (CALCIUM  500 PO), Take by mouth., Disp: , Rfl:    citalopram (CELEXA) 20 MG tablet, Take 20 mg by mouth daily., Disp: , Rfl:    propranolol  (INDERAL ) 10 MG tablet, TAKE 1 TABLET ONCE A DAY FOR A WEEK THEN INCREASE TO 2 TABLET DAILY MONITOR BLOOD PRESSURE AND HEART RATE WHILE TAKING, Disp: , Rfl:    valACYclovir  (VALTREX ) 500 MG tablet, Take 1 tablet (500 mg total) by mouth daily. Or BID for 3 days prn sx, Disp: 90 tablet, Rfl: 3   VITAMIN D PO, Take by mouth daily., Disp: , Rfl:    doxycycline   (VIBRAMYCIN ) 100 MG capsule, Take 1 capsule (100 mg total) by mouth 2 (two) times daily., Disp: 30 capsule, Rfl: 1   propranolol  ER (INDERAL  LA) 80 MG 24 hr capsule, TAKE 1 CAPSULE(80 MG) BY MOUTH DAILY (Patient not taking: Reported on 09/15/2023), Disp: 90 capsule, Rfl: 0     ROS:  Review of Systems  Constitutional:  Negative for fatigue, fever and unexpected weight change.  Respiratory:  Negative for cough, shortness of breath and wheezing.   Cardiovascular:  Negative for chest pain, palpitations and leg swelling.  Gastrointestinal:  Negative for blood  in stool, constipation, diarrhea, nausea and vomiting.  Endocrine: Negative for cold intolerance, heat intolerance and polyuria.  Genitourinary:  Positive for dyspareunia. Negative for dysuria, flank pain, frequency, genital sores, hematuria, menstrual problem, pelvic pain, urgency, vaginal bleeding, vaginal discharge and vaginal pain.  Musculoskeletal:  Negative for back pain, joint swelling and myalgias.  Skin:  Negative for rash.  Neurological:  Negative for dizziness, syncope, light-headedness, numbness and headaches.  Hematological:  Negative for adenopathy.  Psychiatric/Behavioral:  Negative for agitation, confusion, sleep disturbance and suicidal ideas. The patient is not nervous/anxious.    BREAST: No symptoms    Objective: BP 100/70   Ht 5' 1 (1.549 m)   Wt 138 lb (62.6 kg)   BMI 26.07 kg/m    Physical Exam Constitutional:      Appearance: She is well-developed.  Genitourinary:     Vulva normal.     Right Labia: No rash, tenderness or lesions.    Left Labia: No tenderness, lesions or rash.    No vaginal discharge, erythema or tenderness.     Moderate vaginal atrophy present.     Right Adnexa: not tender and no mass present.    Left Adnexa: not tender and no mass present.    No cervical friability or polyp.     Uterus is not enlarged or tender.  Breasts:    Right: No mass, nipple discharge, skin change or  tenderness.     Left: No mass, nipple discharge, skin change or tenderness.  Neck:     Thyroid : No thyromegaly.  Cardiovascular:     Rate and Rhythm: Normal rate and regular rhythm.     Heart sounds: Normal heart sounds. No murmur heard. Pulmonary:     Effort: Pulmonary effort is normal.     Breath sounds: Normal breath sounds.  Abdominal:     Palpations: Abdomen is soft.     Tenderness: There is no abdominal tenderness. There is no guarding or rebound.  Musculoskeletal:        General: Normal range of motion.     Cervical back: Normal range of motion.  Lymphadenopathy:     Cervical: No cervical adenopathy.  Neurological:     General: No focal deficit present.     Mental Status: She is alert and oriented to person, place, and time.     Cranial Nerves: No cranial nerve deficit.  Skin:    General: Skin is warm and dry.  Psychiatric:        Mood and Affect: Mood normal.        Behavior: Behavior normal.        Thought Content: Thought content normal.        Judgment: Judgment normal.  Vitals reviewed.    Assessment/Plan:  Encounter for annual routine gynecological examination  Cervical cancer screening - Plan: Cytology - PAP  Screening for HPV (human papillomavirus) - Plan: Cytology - PAP  Encounter for screening mammogram for malignant neoplasm of breast; pt current on mammo  Herpes simplex vulvovaginitis - Plan: valACYclovir  (VALTREX ) 500 MG tablet; recurrent sx now, Rx valtrex  daily. If sx persist, will increase to 1 g daily as preventive. F/u prn.   IC (interstitial cystitis) - Plan: doxycycline  (VIBRAMYCIN ) 100 MG capsule; recommended IC diet, Rx RF doxy prn sx. F/u prn.   Vaginal atrophy--pt with dyspareunia due to IC; discussed adding vag ERT if wants to be active. F/u prn.    Meds ordered this encounter  Medications   valACYclovir  (VALTREX ) 500 MG tablet  Sig: Take 1 tablet (500 mg total) by mouth daily. Or BID for 3 days prn sx    Dispense:  90 tablet     Refill:  3    Supervising Provider:   CHERRY, ANIKA [[id number]]   doxycycline  (VIBRAMYCIN ) 100 MG capsule    Sig: Take 1 capsule (100 mg total) by mouth 2 (two) times daily.    Dispense:  30 capsule    Refill:  1    Supervising Provider:   CHERRY, ANIKA [[id number]]            GYN counsel breast self exam, mammography screening, menopause, adequate intake of calcium  and vitamin D, diet and exercise    F/U  Return in about 1 year (around 09/14/2024).  Evelyn Jennings B. Edd Reppert, PA-C 09/15/2023 3:28 PM

## 2023-09-15 ENCOUNTER — Encounter: Payer: Self-pay | Admitting: Obstetrics and Gynecology

## 2023-09-15 ENCOUNTER — Other Ambulatory Visit (HOSPITAL_COMMUNITY)
Admission: RE | Admit: 2023-09-15 | Discharge: 2023-09-15 | Disposition: A | Discharge status: Home / Self Care | Payer: BC Managed Care – PPO | Source: Ambulatory Visit | Attending: Obstetrics and Gynecology | Admitting: Obstetrics and Gynecology

## 2023-09-15 ENCOUNTER — Ambulatory Visit (INDEPENDENT_AMBULATORY_CARE_PROVIDER_SITE_OTHER): Payer: BC Managed Care – PPO | Admitting: Obstetrics and Gynecology

## 2023-09-15 VITALS — BP 100/70 | Ht 61.0 in | Wt 138.0 lb

## 2023-09-15 DIAGNOSIS — A6004 Herpesviral vulvovaginitis: Secondary | ICD-10-CM | POA: Insufficient documentation

## 2023-09-15 DIAGNOSIS — Z01419 Encounter for gynecological examination (general) (routine) without abnormal findings: Secondary | ICD-10-CM | POA: Diagnosis not present

## 2023-09-15 DIAGNOSIS — Z1151 Encounter for screening for human papillomavirus (HPV): Secondary | ICD-10-CM | POA: Diagnosis not present

## 2023-09-15 DIAGNOSIS — Z1231 Encounter for screening mammogram for malignant neoplasm of breast: Secondary | ICD-10-CM

## 2023-09-15 DIAGNOSIS — Z124 Encounter for screening for malignant neoplasm of cervix: Secondary | ICD-10-CM

## 2023-09-15 DIAGNOSIS — N952 Postmenopausal atrophic vaginitis: Secondary | ICD-10-CM | POA: Insufficient documentation

## 2023-09-15 DIAGNOSIS — N3010 Interstitial cystitis (chronic) without hematuria: Secondary | ICD-10-CM

## 2023-09-15 MED ORDER — VALACYCLOVIR HCL 500 MG PO TABS: 500.0000 mg | ORAL_TABLET | Freq: Every day | ORAL | 3 refills | Status: DC

## 2023-09-15 MED ORDER — DOXYCYCLINE HYCLATE 100 MG PO CAPS: 100.0000 mg | ORAL_CAPSULE | Freq: Two times a day (BID) | ORAL | 1 refills | Status: AC

## 2023-09-15 NOTE — Patient Instructions (Signed)
 I value your feedback and you entrusting [place] with your care. If you get a [place] patient survey, I would appreciate you taking the time to let us know about your experience today. Thank you! ? ? ?

## 2023-09-17 LAB — CYTOLOGY - PAP
Comment: NEGATIVE
Diagnosis: NEGATIVE
High risk HPV: NEGATIVE

## 2023-11-09 DIAGNOSIS — F411 Generalized anxiety disorder: Secondary | ICD-10-CM | POA: Diagnosis not present

## 2023-11-24 ENCOUNTER — Encounter: Payer: Self-pay | Admitting: Internal Medicine

## 2023-11-24 ENCOUNTER — Ambulatory Visit (INDEPENDENT_AMBULATORY_CARE_PROVIDER_SITE_OTHER): Payer: BC Managed Care – PPO | Admitting: Internal Medicine

## 2023-11-24 VITALS — BP 110/62 | Ht 61.0 in | Wt 137.6 lb

## 2023-11-24 DIAGNOSIS — E663 Overweight: Secondary | ICD-10-CM

## 2023-11-24 DIAGNOSIS — M5030 Other cervical disc degeneration, unspecified cervical region: Secondary | ICD-10-CM | POA: Diagnosis not present

## 2023-11-24 DIAGNOSIS — G250 Essential tremor: Secondary | ICD-10-CM

## 2023-11-24 DIAGNOSIS — F32A Depression, unspecified: Secondary | ICD-10-CM

## 2023-11-24 DIAGNOSIS — K219 Gastro-esophageal reflux disease without esophagitis: Secondary | ICD-10-CM

## 2023-11-24 DIAGNOSIS — E7800 Pure hypercholesterolemia, unspecified: Secondary | ICD-10-CM

## 2023-11-24 DIAGNOSIS — J4520 Mild intermittent asthma, uncomplicated: Secondary | ICD-10-CM | POA: Diagnosis not present

## 2023-11-24 DIAGNOSIS — N3010 Interstitial cystitis (chronic) without hematuria: Secondary | ICD-10-CM

## 2023-11-24 DIAGNOSIS — M8589 Other specified disorders of bone density and structure, multiple sites: Secondary | ICD-10-CM

## 2023-11-24 DIAGNOSIS — Z6826 Body mass index (BMI) 26.0-26.9, adult: Secondary | ICD-10-CM

## 2023-11-24 DIAGNOSIS — F419 Anxiety disorder, unspecified: Secondary | ICD-10-CM

## 2023-11-24 NOTE — Assessment & Plan Note (Signed)
 Continue calcium in addition to vitamin D Encourage daily weightbearing exercise

## 2023-11-24 NOTE — Patient Instructions (Signed)
 Tremor A tremor is trembling or shaking that you cannot control. Most tremors affect the hands or arms. Tremors can also affect the head, vocal cords, face, and other parts of the body. There are many types of tremors. Common types include: Essential tremor. These usually occur in people older than 40. This type of tremor may run in families and can happen in otherwise healthy people. Resting tremor. These occur when the muscles are at rest, such as when your hands are resting in your lap. People with Parkinson's disease often have resting tremors. Postural tremor. These occur when you try to hold a pose, such as keeping your hands outstretched. Kinetic tremor. These occur during purposeful movement, such as trying to touch a finger to your nose. Task-specific tremor. These may occur when you do certain tasks such as writing, speaking, or standing. Psychogenic tremor. These are greatly reduced or go away when you are distracted. These tremors happen due to underlying stress or psychiatric disease. They can happen in people of all ages. Some types of tremors have no known cause. Tremors can also be a symptom of nervous system problems (neurological disorders) that may occur with aging. Some tremors go away with treatment, while others do not. Follow these instructions at home: Lifestyle     If you drink alcohol: Limit how much you have to: 0-1 drink a day for women who are not pregnant. 0-2 drinks a day for men. Know how much alcohol is in a drink. In the [place] one drink equals one 12 oz bottle of beer (355 mL), one 5 oz glass of wine (148 mL), or one 1 oz glass of hard liquor (44 mL). Do not use any products that contain nicotine or tobacco. These products include cigarettes, chewing tobacco, and vaping devices, such as e-cigarettes. If you need help quitting, ask your health care provider. Avoid extreme heat and extreme cold. Limit your caffeine intake, as told by your health care  provider. Try to get 8 hours of sleep each night. Find ways to manage your stress, such as meditation or yoga. General instructions Take over-the-counter and prescription medicines only as told by your health care provider. Keep all follow-up visits. This is important. Contact a health care provider if: You develop a tremor after starting a new medicine. You have a tremor along with other symptoms such as: Numbness. Tingling. Pain. Weakness. Your tremor gets worse. Your tremor interferes with your day-to-day life. Summary A tremor is trembling or shaking that you cannot control. Most tremors affect the hands or arms. Some types of tremors have no known cause. Others may be a symptom of nervous system problems (neurological disorders). Make sure you discuss any tremors you have with your health care provider. This information is not intended to replace advice given to you by your health care provider. Make sure you discuss any questions you have with your health care provider. Document Revised: 06/07/2021 Document Reviewed: 06/07/2021 Elsevier Patient Education  2024 [employer].
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
## 2023-11-24 NOTE — Assessment & Plan Note (Signed)
 Controlled on propranolol She no longer wants to follow with neurology

## 2023-11-24 NOTE — Assessment & Plan Note (Signed)
 Continue albuterol as needed

## 2023-11-24 NOTE — Assessment & Plan Note (Signed)
Continue doxycycline as needed

## 2023-11-24 NOTE — Assessment & Plan Note (Signed)
 Encourage diet and exercise for weight loss

## 2023-11-24 NOTE — Assessment & Plan Note (Signed)
 Encouraged heat and stretching

## 2023-11-24 NOTE — Assessment & Plan Note (Signed)
 Stable on citalopram and xanax Support offered She will continue to meet with  her psychiatrist

## 2023-11-24 NOTE — Assessment & Plan Note (Signed)
 Not medicated Monitor for increased difficulty swallowing

## 2023-11-24 NOTE — Assessment & Plan Note (Signed)
C-Met and lipid profile today Encouraged her to consume a low-fat diet Continue atorvastatin 

## 2023-12-03 ENCOUNTER — Other Ambulatory Visit: Payer: Self-pay | Admitting: Internal Medicine

## 2023-12-04 NOTE — Telephone Encounter (Signed)
 Requested Prescriptions  Pending Prescriptions Disp Refills   propranolol ER (INDERAL LA) 80 MG 24 hr capsule [Pharmacy Med Name: PROPRANOLOL ER 80MG  CAPSULES] 90 capsule 0    Sig: TAKE 1 CAPSULE(80 MG) BY MOUTH DAILY     Cardiovascular:  Beta Blockers Passed - 12/04/2023 10:03 AM      Passed - Last BP in normal range    BP Readings from Last 1 Encounters:  11/24/23 110/62         Passed - Last Heart Rate in normal range    Pulse Readings from Last 1 Encounters:  05/26/23 (!) 55         Passed - Valid encounter within last 6 months    Recent Outpatient Visits           1 week ago Mild intermittent asthma without status asthmaticus without complication   [place] [facility] [place], [place], [place]

## 2024-03-03 ENCOUNTER — Other Ambulatory Visit: Payer: Self-pay | Admitting: Internal Medicine

## 2024-03-07 ENCOUNTER — Other Ambulatory Visit: Payer: Self-pay

## 2024-03-07 MED ORDER — PROPRANOLOL HCL ER 80 MG PO CP24: 80.0000 mg | ORAL_CAPSULE | Freq: Every day | ORAL | 0 refills | Status: DC

## 2024-03-07 NOTE — Telephone Encounter (Signed)
 Requested Prescriptions  Refused Prescriptions Disp Refills   propranolol  ER (INDERAL  LA) 80 MG 24 hr capsule [Pharmacy Med Name: PROPRANOLOL  ER 80MG  CAPSULES] 90 capsule 0    Sig: TAKE 1 CAPSULE(80 MG) BY MOUTH DAILY     Cardiovascular:  Beta Blockers Passed - 03/07/2024  1:22 PM      Passed - Last BP in normal range    BP Readings from Last 1 Encounters:  11/24/23 110/62         Passed - Last Heart Rate in normal range    Pulse Readings from Last 1 Encounters:  05/26/23 (!) 55         Passed - Valid encounter within last 6 months    Recent Outpatient Visits           3 months ago Mild intermittent asthma without status asthmaticus without complication   [place] [facility] [place], [place], [place]

## 2024-05-26 DIAGNOSIS — R739 Hyperglycemia, unspecified: Secondary | ICD-10-CM | POA: Diagnosis not present

## 2024-05-26 DIAGNOSIS — F419 Anxiety disorder, unspecified: Secondary | ICD-10-CM | POA: Diagnosis not present

## 2024-05-26 DIAGNOSIS — E7800 Pure hypercholesterolemia, unspecified: Secondary | ICD-10-CM | POA: Diagnosis not present

## 2024-05-26 DIAGNOSIS — F32A Depression, unspecified: Secondary | ICD-10-CM | POA: Diagnosis not present

## 2024-05-26 DIAGNOSIS — Z0000 Encounter for general adult medical examination without abnormal findings: Secondary | ICD-10-CM | POA: Diagnosis not present

## 2024-05-27 ENCOUNTER — Encounter: Admitting: Internal Medicine

## 2024-06-02 ENCOUNTER — Other Ambulatory Visit: Payer: Self-pay | Admitting: Family Medicine

## 2024-06-02 DIAGNOSIS — E7800 Pure hypercholesterolemia, unspecified: Secondary | ICD-10-CM

## 2024-06-02 DIAGNOSIS — Z23 Encounter for immunization: Secondary | ICD-10-CM | POA: Diagnosis not present

## 2024-06-05 ENCOUNTER — Other Ambulatory Visit: Payer: Self-pay | Admitting: Internal Medicine

## 2024-06-07 ENCOUNTER — Ambulatory Visit
Admission: RE | Admit: 2024-06-07 | Discharge: 2024-06-07 | Disposition: A | Discharge status: Home / Self Care | Payer: Self-pay | Source: Ambulatory Visit | Attending: Family Medicine | Admitting: Family Medicine

## 2024-06-07 DIAGNOSIS — E7800 Pure hypercholesterolemia, unspecified: Secondary | ICD-10-CM | POA: Insufficient documentation

## 2024-06-07 NOTE — Telephone Encounter (Signed)
 Requested medications are due for refill today.  yes  Requested medications are on the active medications list.  yes  Last refill. 03/07/2024 #90 0 rf  Future visit scheduled.   no  Notes to clinic.  It appears that pt is now going to a [facility] provider    Requested Prescriptions  Pending Prescriptions Disp Refills   propranolol  ER (INDERAL  LA) 80 MG 24 hr capsule [Pharmacy Med Name: PROPRANOLOL  ER 80MG  CAPSULES] 90 capsule 0    Sig: TAKE 1 CAPSULE(80 MG) BY MOUTH DAILY     Cardiovascular:  Beta Blockers Failed - 06/07/2024  9:23 AM      Failed - Valid encounter within last 6 months    Recent Outpatient Visits           6 months ago Mild intermittent asthma without status asthmaticus without complication   [place] [facility] [place], [place] W, NP              Passed - Last BP in normal range    BP Readings from Last 1 Encounters:  11/24/23 110/62         Passed - Last Heart Rate in normal range    Pulse Readings from Last 1 Encounters:  05/26/23 (!) 55

## 2024-09-09 ENCOUNTER — Other Ambulatory Visit: Payer: Self-pay | Admitting: Internal Medicine

## 2024-09-09 NOTE — Telephone Encounter (Signed)
 Requested medication (s) are due for refill today: Yes  Requested medication (s) are on the active medication list: Yes  Last refill:  06/07/24  Future visit scheduled: No  Notes to clinic:  Is pt. Still seen at the practice?    Requested Prescriptions  Pending Prescriptions Disp Refills   propranolol  ER (INDERAL  LA) 80 MG 24 hr capsule [Pharmacy Med Name: PROPRANOLOL  ER 80MG  CAPSULES] 90 capsule 0    Sig: TAKE 1 CAPSULE(80 MG) BY MOUTH DAILY     Cardiovascular:  Beta Blockers Failed - 09/09/2024  3:49 PM      Failed - Valid encounter within last 6 months    Recent Outpatient Visits           9 months ago Mild intermittent asthma without status asthmaticus without complication   [place] [facility] [place], [place] W, NP              Passed - Last BP in normal range    BP Readings from Last 1 Encounters:  11/24/23 110/62         Passed - Last Heart Rate in normal range    Pulse Readings from Last 1 Encounters:  05/26/23 (!) 55

## 2024-09-15 ENCOUNTER — Other Ambulatory Visit: Payer: Self-pay | Admitting: Family Medicine

## 2024-09-15 DIAGNOSIS — Z1231 Encounter for screening mammogram for malignant neoplasm of breast: Secondary | ICD-10-CM

## 2024-10-12 ENCOUNTER — Ambulatory Visit
